# Patient Record
Sex: Male | Born: 1960 | Race: Black or African American | Hispanic: No | Marital: Married | Smoking: Current every day smoker
Health system: Southern US, Community
[De-identification: ages and names within clinical notes are randomized; demographics above are authoritative.]

## PROBLEM LIST (undated history)

## (undated) DIAGNOSIS — R7303 Prediabetes: Secondary | ICD-10-CM

## (undated) DIAGNOSIS — E669 Obesity, unspecified: Secondary | ICD-10-CM

## (undated) DIAGNOSIS — G459 Transient cerebral ischemic attack, unspecified: Secondary | ICD-10-CM

---

## 2011-08-06 ENCOUNTER — Emergency Department: Payer: Self-pay | Admitting: Emergency Medicine

## 2011-08-09 ENCOUNTER — Emergency Department: Payer: Self-pay | Admitting: Emergency Medicine

## 2021-07-04 ENCOUNTER — Observation Stay (HOSPITAL_COMMUNITY)
Admission: EM | Admit: 2021-07-04 | Discharge: 2021-07-06 | Disposition: A | Discharge status: Home / Self Care | Payer: 59 | Attending: Internal Medicine | Admitting: Internal Medicine

## 2021-07-04 ENCOUNTER — Emergency Department (HOSPITAL_COMMUNITY): Payer: 59

## 2021-07-04 DIAGNOSIS — R531 Weakness: Secondary | ICD-10-CM | POA: Diagnosis present

## 2021-07-04 DIAGNOSIS — R030 Elevated blood-pressure reading, without diagnosis of hypertension: Secondary | ICD-10-CM | POA: Diagnosis not present

## 2021-07-04 DIAGNOSIS — I639 Cerebral infarction, unspecified: Secondary | ICD-10-CM | POA: Diagnosis not present

## 2021-07-04 DIAGNOSIS — Z20822 Contact with and (suspected) exposure to covid-19: Secondary | ICD-10-CM | POA: Insufficient documentation

## 2021-07-04 DIAGNOSIS — F17210 Nicotine dependence, cigarettes, uncomplicated: Secondary | ICD-10-CM | POA: Diagnosis not present

## 2021-07-04 DIAGNOSIS — R7303 Prediabetes: Secondary | ICD-10-CM | POA: Diagnosis present

## 2021-07-04 DIAGNOSIS — Z23 Encounter for immunization: Secondary | ICD-10-CM | POA: Insufficient documentation

## 2021-07-04 DIAGNOSIS — E876 Hypokalemia: Secondary | ICD-10-CM | POA: Insufficient documentation

## 2021-07-04 DIAGNOSIS — G459 Transient cerebral ischemic attack, unspecified: Secondary | ICD-10-CM

## 2021-07-04 HISTORY — DX: Prediabetes: R73.03

## 2021-07-04 HISTORY — DX: Obesity, unspecified: E66.9

## 2021-07-04 LAB — CBG MONITORING, ED: Glucose-Capillary: 169 mg/dL — ABNORMAL HIGH (ref 70–99)

## 2021-07-04 MED ORDER — SODIUM CHLORIDE 0.9% FLUSH: 3.0000 mL | Freq: Once | INTRAVENOUS | Status: DC

## 2021-07-05 ENCOUNTER — Observation Stay (HOSPITAL_BASED_OUTPATIENT_CLINIC_OR_DEPARTMENT_OTHER): Payer: 59

## 2021-07-05 ENCOUNTER — Encounter (HOSPITAL_COMMUNITY): Payer: Self-pay | Admitting: Internal Medicine

## 2021-07-05 ENCOUNTER — Observation Stay (HOSPITAL_COMMUNITY): Payer: 59

## 2021-07-05 DIAGNOSIS — F17210 Nicotine dependence, cigarettes, uncomplicated: Secondary | ICD-10-CM | POA: Diagnosis not present

## 2021-07-05 DIAGNOSIS — E1169 Type 2 diabetes mellitus with other specified complication: Secondary | ICD-10-CM

## 2021-07-05 DIAGNOSIS — G459 Transient cerebral ischemic attack, unspecified: Secondary | ICD-10-CM | POA: Diagnosis not present

## 2021-07-05 DIAGNOSIS — R030 Elevated blood-pressure reading, without diagnosis of hypertension: Secondary | ICD-10-CM | POA: Diagnosis not present

## 2021-07-05 DIAGNOSIS — R7303 Prediabetes: Secondary | ICD-10-CM | POA: Diagnosis present

## 2021-07-05 DIAGNOSIS — I10 Essential (primary) hypertension: Secondary | ICD-10-CM | POA: Diagnosis not present

## 2021-07-05 DIAGNOSIS — R200 Anesthesia of skin: Secondary | ICD-10-CM

## 2021-07-05 DIAGNOSIS — R29701 NIHSS score 1: Secondary | ICD-10-CM

## 2021-07-05 DIAGNOSIS — R531 Weakness: Secondary | ICD-10-CM

## 2021-07-05 DIAGNOSIS — R2990 Unspecified symptoms and signs involving the nervous system: Secondary | ICD-10-CM

## 2021-07-05 LAB — I-STAT CHEM 8, ED
BUN: 9 mg/dL (ref 8–23)
Calcium, Ion: 1.16 mmol/L (ref 1.15–1.40)
Chloride: 102 mmol/L (ref 98–111)
Creatinine, Ser: 0.9 mg/dL (ref 0.61–1.24)
Glucose, Bld: 161 mg/dL — ABNORMAL HIGH (ref 70–99)
HCT: 44 % (ref 39.0–52.0)
Hemoglobin: 15 g/dL (ref 13.0–17.0)
Potassium: 3.4 mmol/L — ABNORMAL LOW (ref 3.5–5.1)
Sodium: 141 mmol/L (ref 135–145)
TCO2: 26 mmol/L (ref 22–32)

## 2021-07-05 LAB — COMPREHENSIVE METABOLIC PANEL
ALT: 27 U/L (ref 0–44)
ALT: 28 U/L (ref 0–44)
AST: 27 U/L (ref 15–41)
AST: 30 U/L (ref 15–41)
Albumin: 3.7 g/dL (ref 3.5–5.0)
Albumin: 3.9 g/dL (ref 3.5–5.0)
Alkaline Phosphatase: 77 U/L (ref 38–126)
Alkaline Phosphatase: 80 U/L (ref 38–126)
Anion gap: 10 (ref 5–15)
Anion gap: 9 (ref 5–15)
BUN: 8 mg/dL (ref 8–23)
BUN: 9 mg/dL (ref 8–23)
CO2: 24 mmol/L (ref 22–32)
CO2: 26 mmol/L (ref 22–32)
Calcium: 9.5 mg/dL (ref 8.9–10.3)
Calcium: 9.6 mg/dL (ref 8.9–10.3)
Chloride: 103 mmol/L (ref 98–111)
Chloride: 103 mmol/L (ref 98–111)
Creatinine, Ser: 0.93 mg/dL (ref 0.61–1.24)
Creatinine, Ser: 0.93 mg/dL (ref 0.61–1.24)
GFR, Estimated: >60 mL/min (ref >60)
GFR, Estimated: >60 mL/min (ref >60)
Glucose, Bld: 128 mg/dL — ABNORMAL HIGH (ref 70–99)
Glucose, Bld: 160 mg/dL — ABNORMAL HIGH (ref 70–99)
Potassium: 3.4 mmol/L — ABNORMAL LOW (ref 3.5–5.1)
Potassium: 3.8 mmol/L (ref 3.5–5.1)
Sodium: 137 mmol/L (ref 135–145)
Sodium: 138 mmol/L (ref 135–145)
Total Bilirubin: 0.8 mg/dL (ref 0.3–1.2)
Total Bilirubin: 0.8 mg/dL (ref 0.3–1.2)
Total Protein: 7.7 g/dL (ref 6.5–8.1)
Total Protein: 8 g/dL (ref 6.5–8.1)

## 2021-07-05 LAB — CBC WITH DIFFERENTIAL/PLATELET
Abs Immature Granulocytes: 0.02 10*3/uL (ref 0.00–0.07)
Basophils Absolute: 0 10*3/uL (ref 0.0–0.1)
Basophils Relative: 0 %
Eosinophils Absolute: 0.1 10*3/uL (ref 0.0–0.5)
Eosinophils Relative: 2 %
HCT: 40.8 % (ref 39.0–52.0)
Hemoglobin: 13.8 g/dL (ref 13.0–17.0)
Immature Granulocytes: 0 %
Lymphocytes Relative: 30 %
Lymphs Abs: 2.5 10*3/uL (ref 0.7–4.0)
MCH: 28.6 pg (ref 26.0–34.0)
MCHC: 33.8 g/dL (ref 30.0–36.0)
MCV: 84.5 fL (ref 80.0–100.0)
Monocytes Absolute: 0.7 10*3/uL (ref 0.1–1.0)
Monocytes Relative: 8 %
Neutro Abs: 5 10*3/uL (ref 1.7–7.7)
Neutrophils Relative %: 60 %
Platelets: 241 10*3/uL (ref 150–400)
RBC: 4.83 MIL/uL (ref 4.22–5.81)
RDW: 14.5 % (ref 11.5–15.5)
WBC: 8.5 10*3/uL (ref 4.0–10.5)
nRBC: 0 % (ref 0.0–0.2)

## 2021-07-05 LAB — CBC
HCT: 41.7 % (ref 39.0–52.0)
Hemoglobin: 13.8 g/dL (ref 13.0–17.0)
MCH: 28.1 pg (ref 26.0–34.0)
MCHC: 33.1 g/dL (ref 30.0–36.0)
MCV: 84.9 fL (ref 80.0–100.0)
Platelets: 242 10*3/uL (ref 150–400)
RBC: 4.91 MIL/uL (ref 4.22–5.81)
RDW: 14.3 % (ref 11.5–15.5)
WBC: 8.8 10*3/uL (ref 4.0–10.5)
nRBC: 0 % (ref 0.0–0.2)

## 2021-07-05 LAB — DIFFERENTIAL
Abs Immature Granulocytes: 0.02 10*3/uL (ref 0.00–0.07)
Basophils Absolute: 0 10*3/uL (ref 0.0–0.1)
Basophils Relative: 0 %
Eosinophils Absolute: 0.3 10*3/uL (ref 0.0–0.5)
Eosinophils Relative: 3 %
Immature Granulocytes: 0 %
Lymphocytes Relative: 45 %
Lymphs Abs: 4 10*3/uL (ref 0.7–4.0)
Monocytes Absolute: 0.6 10*3/uL (ref 0.1–1.0)
Monocytes Relative: 7 %
Neutro Abs: 3.9 10*3/uL (ref 1.7–7.7)
Neutrophils Relative %: 45 %

## 2021-07-05 LAB — ECHOCARDIOGRAM COMPLETE BUBBLE STUDY
AR max vel: 2.02 cm2
AV Area VTI: 2.08 cm2
AV Area mean vel: 1.78 cm2
AV Mean grad: 5.5 mmHg
AV Peak grad: 9.6 mmHg
Ao pk vel: 1.55 m/s
Area-P 1/2: 2.36 cm2
MV M vel: 1.18 m/s
MV Peak grad: 5.6 mmHg
S' Lateral: 3.45 cm

## 2021-07-05 LAB — RESP PANEL BY RT-PCR (FLU A&B, COVID) ARPGX2
Influenza A by PCR: NEGATIVE
Influenza B by PCR: NEGATIVE
SARS Coronavirus 2 by RT PCR: NEGATIVE

## 2021-07-05 LAB — HEMOGLOBIN A1C
Hgb A1c MFr Bld: 6.2 % — ABNORMAL HIGH (ref 4.8–5.6)
Mean Plasma Glucose: 131.24 mg/dL

## 2021-07-05 LAB — GLUCOSE, CAPILLARY: Glucose-Capillary: 126 mg/dL — ABNORMAL HIGH (ref 70–99)

## 2021-07-05 LAB — CBG MONITORING, ED
Glucose-Capillary: 120 mg/dL — ABNORMAL HIGH (ref 70–99)
Glucose-Capillary: 129 mg/dL — ABNORMAL HIGH (ref 70–99)
Glucose-Capillary: 124 mg/dL — ABNORMAL HIGH (ref 70–99)

## 2021-07-05 LAB — LIPID PANEL
Cholesterol: 237 mg/dL — ABNORMAL HIGH (ref 0–200)
HDL: 40 mg/dL — ABNORMAL LOW (ref >40)
LDL Cholesterol: 168 mg/dL — ABNORMAL HIGH (ref 0–99)
Total CHOL/HDL Ratio: 5.9 RATIO
Triglycerides: 145 mg/dL (ref <150)
VLDL: 29 mg/dL (ref 0–40)

## 2021-07-05 LAB — PROTIME-INR
INR: 1 (ref 0.8–1.2)
Prothrombin Time: 13.7 seconds (ref 11.4–15.2)

## 2021-07-05 LAB — APTT: aPTT: 30 seconds (ref 24–36)

## 2021-07-05 LAB — HIV ANTIBODY (ROUTINE TESTING W REFLEX): HIV Screen 4th Generation wRfx: NONREACTIVE

## 2021-07-05 LAB — TSH: TSH: 2.324 u[IU]/mL (ref 0.350–4.500)

## 2021-07-05 LAB — MAGNESIUM: Magnesium: 1.8 mg/dL (ref 1.7–2.4)

## 2021-07-05 MED ORDER — ONDANSETRON HCL 4 MG/2ML IJ SOLN: 4.0000 mg | Freq: Four times a day (QID) | INTRAMUSCULAR | Status: DC | PRN

## 2021-07-05 MED ORDER — INSULIN ASPART 100 UNIT/ML IJ SOLN
0.0000 [IU] | Freq: Three times a day (TID) | INTRAMUSCULAR | Status: DC
  Administered 2021-07-05 – 2021-07-06 (×4): 2 [IU] via SUBCUTANEOUS

## 2021-07-05 MED ORDER — ENOXAPARIN SODIUM 40 MG/0.4ML IJ SOSY
40.0000 mg | PREFILLED_SYRINGE | INTRAMUSCULAR | Status: DC
  Administered 2021-07-05 – 2021-07-06 (×2): 40 mg via SUBCUTANEOUS
  Filled 2021-07-05 (×2): qty 0.4

## 2021-07-05 MED ORDER — ONDANSETRON HCL 4 MG PO TABS: 4.0000 mg | ORAL_TABLET | Freq: Four times a day (QID) | ORAL | Status: DC | PRN

## 2021-07-05 MED ORDER — STROKE: EARLY STAGES OF RECOVERY BOOK
Freq: Once | Status: AC
  Administered 2021-07-05: 1
  Filled 2021-07-05: qty 1

## 2021-07-05 MED ORDER — POLYETHYLENE GLYCOL 3350 17 G PO PACK: 17.0000 g | PACK | Freq: Every day | ORAL | Status: DC | PRN

## 2021-07-05 MED ORDER — CLOPIDOGREL BISULFATE 75 MG PO TABS
75.0000 mg | ORAL_TABLET | Freq: Every day | ORAL | Status: DC
  Administered 2021-07-05 – 2021-07-06 (×2): 75 mg via ORAL
  Filled 2021-07-05 (×2): qty 1

## 2021-07-05 MED ORDER — IOHEXOL 350 MG/ML SOLN
100.0000 mL | Freq: Once | INTRAVENOUS | Status: AC | PRN
  Administered 2021-07-05: 100 mL via INTRAVENOUS

## 2021-07-05 MED ORDER — ASPIRIN EC 81 MG PO TBEC
81.0000 mg | DELAYED_RELEASE_TABLET | Freq: Every day | ORAL | Status: DC
  Administered 2021-07-06: 81 mg via ORAL
  Filled 2021-07-05: qty 1

## 2021-07-05 MED ORDER — ACETAMINOPHEN 650 MG RE SUPP: 650.0000 mg | Freq: Four times a day (QID) | RECTAL | Status: DC | PRN

## 2021-07-05 MED ORDER — ACETAMINOPHEN 325 MG PO TABS: 650.0000 mg | ORAL_TABLET | Freq: Four times a day (QID) | ORAL | Status: DC | PRN

## 2021-07-05 MED ORDER — ATORVASTATIN CALCIUM 80 MG PO TABS
80.0000 mg | ORAL_TABLET | Freq: Every day | ORAL | Status: DC
  Administered 2021-07-05 – 2021-07-06 (×2): 80 mg via ORAL
  Filled 2021-07-05: qty 2
  Filled 2021-07-05: qty 1

## 2021-07-05 MED ORDER — ASPIRIN 325 MG PO TABS
325.0000 mg | ORAL_TABLET | Freq: Once | ORAL | Status: AC
  Administered 2021-07-05: 325 mg via ORAL
  Filled 2021-07-05: qty 1

## 2021-07-05 NOTE — ED Notes (Signed)
PT reports a functional LLE weakness (dragging when walking) despite NIH 0/ and neuro assessment WDL.

## 2021-07-05 NOTE — Progress Notes (Signed)
2D echocardiogram with bubbles completed.  07/05/2021 9:35 AM Eula Fried., MHA, RVT, RDCS, RDMS

## 2021-07-05 NOTE — Evaluation (Signed)
Physical Therapy Evaluation Patient Details Name: Anthony Joseph MRN: [id number] DOB: May 04, 1961 Today's Date: 07/05/2021  History of Present Illness  Pt is a 60 y/o male admitted 10/23 secondary to LLE weakness. CT negative. Further workup pending. PMH includes DM and HTN.  Clinical Impression  Pt admitted secondary to problem above with deficits below. Pt requiring min A secondary to unsteadiness and LLE weakness during mobility. Pt tended to drag LLE and presented with poor coordination. Pt very adamant about returning home at d/c. Feel he would benefit from neuro outpatient follow up at d/c. Will continue to follow acutely.        Recommendations for follow up therapy are one component of a multi-disciplinary discharge planning process, led by the attending physician.  Recommendations may be updated based on patient status, additional functional criteria and insurance authorization.  Follow Up Recommendations Outpatient PT;Supervision for mobility/OOB (pending progression)    Equipment Recommendations  Rolling walker with 5" wheels    Recommendations for Other Services       Precautions / Restrictions Precautions Precautions: Fall Restrictions Weight Bearing Restrictions: No      Mobility  Bed Mobility Overal bed mobility: Needs Assistance Bed Mobility: Supine to Sit;Sit to Supine     Supine to sit: Min assist Sit to supine: Min assist   General bed mobility comments: Required assist for LLE management throughout bed mobility. Pt with increased difficulty sliding LLE off the bed to come to sitting.    Transfers Overall transfer level: Needs assistance Equipment used: None Transfers: Sit to/from Stand Sit to Stand: Min assist         General transfer comment: Min A for steadying assist to stand  Ambulation/Gait Ambulation/Gait assistance: Min assist Gait Distance (Feet): 10 Feet Assistive device: None Gait Pattern/deviations: Step-to pattern;Decreased  weight shift to left Gait velocity: Decreased   General Gait Details: Very unsteady gait without AD. Pt dragging LLE throughout and noted buckling of L knee. Min A for steadying assist.  Stairs            Wheelchair Mobility    Modified Rankin (Stroke Patients Only) Modified Rankin (Stroke Patients Only) Pre-Morbid Rankin Score: No symptoms Modified Rankin: Moderately severe disability     Balance Overall balance assessment: Needs assistance Sitting-balance support: No upper extremity supported;Feet supported Sitting balance-Leahy Scale: Fair     Standing balance support: No upper extremity supported;During functional activity Standing balance-Leahy Scale: Poor Standing balance comment: Reliant on external support                             Pertinent Vitals/Pain Pain Assessment: No/denies pain    Home Living Family/patient expects to be discharged to:: Private residence Living Arrangements: Spouse/significant other;Children Available Help at Discharge: Family Type of Home: House Home Access: Stairs to enter Entrance Stairs-Rails: None Secretary/administrator of Steps: 2 Home Layout: One level Home Equipment: Cane - single point      Prior Function Level of Independence: Independent               Hand Dominance        Extremity/Trunk Assessment   Upper Extremity Assessment Upper Extremity Assessment: Defer to OT evaluation;LUE deficits/detail LUE Deficits / Details: Decreased coordination noted when performing finger to nose test. Also noted functional weakness.    Lower Extremity Assessment Lower Extremity Assessment: LLE deficits/detail LLE Deficits / Details: Functional weakness noted, with decreased coordination. tended to "drag" LLE during mobility  tasks. Pt reports "it feels like the circulation is not in it"    Cervical / Trunk Assessment Cervical / Trunk Assessment: Normal  Communication   Communication: No difficulties   Cognition Arousal/Alertness: Awake/alert Behavior During Therapy: WFL for tasks assessed/performed Overall Cognitive Status: Within Functional Limits for tasks assessed                                        General Comments General comments (skin integrity, edema, etc.): No family present    Exercises     Assessment/Plan    PT Assessment Patient needs continued PT services  PT Problem List Decreased strength;Decreased activity tolerance;Decreased balance;Decreased mobility;Decreased knowledge of use of DME;Decreased knowledge of precautions;Decreased coordination;Decreased safety awareness       PT Treatment Interventions DME instruction;Gait training;Functional mobility training;Therapeutic activities;Therapeutic exercise;Balance training;Patient/family education;Stair training;Neuromuscular re-education    PT Goals (Current goals can be found in the Care Plan section)  Acute Rehab PT Goals Patient Stated Goal: to go home PT Goal Formulation: With patient Time For Goal Achievement: 07/19/21 Potential to Achieve Goals: Good    Frequency Min 4X/week   Barriers to discharge        Co-evaluation               AM-PAC PT "6 Clicks" Mobility  Outcome Measure Help needed turning from your back to your side while in a flat bed without using bedrails?: None Help needed moving from lying on your back to sitting on the side of a flat bed without using bedrails?: A Little Help needed moving to and from a bed to a chair (including a wheelchair)?: A Little Help needed standing up from a chair using your arms (e.g., wheelchair or bedside chair)?: A Little Help needed to walk in hospital room?: A Lot Help needed climbing 3-5 steps with a railing? : Total 6 Click Score: 16    End of Session Equipment Utilized During Treatment: Gait belt Activity Tolerance: Patient tolerated treatment well Patient left: in bed;with call bell/phone within reach (on stretcher in  ED) Nurse Communication: Mobility status PT Visit Diagnosis: Unsteadiness on feet (R26.81);Muscle weakness (generalized) (M62.81)    Time: 4627-0350 PT Time Calculation (min) (ACUTE ONLY): 18 min   Charges:   PT Evaluation $PT Eval Moderate Complexity: 1 Mod          Farley Ly, PT, DPT  Acute Rehabilitation Services  Pager: [phone] Office: [phone]   Lehman Prom 07/05/2021, 1:48 PM

## 2021-07-05 NOTE — Assessment & Plan Note (Signed)
   Counseling on cessation  Patient declines nicotine replacement therapy

## 2021-07-05 NOTE — Progress Notes (Signed)
Care started prior to midnight in the emergency room and the patient was admitted early this morning after midnight by Dr. Brynda Peon and I am in current agreement with his assessment and plan.  Additional changes to the plan of care been made accordingly.  The patient is a 60 year old obese male with a past medical history significant for but not limited to prediabetes as well as other comorbidities who presented with left-sided numbness and weakness.  Approximately around 8 PM after cooking steaks for himself and his daughter he was sitting down to eat recently noticed that his left hand has sensation of intense numbness.  Within several minutes the sensation was followed by almost complete paralysis of the left arm in addition to the left leg.  Patient also reported a left facial droop could not any headaches, changes in vision or difficulty with speech.  EMS was contacted who brought the patient to the ED for code stroke.  Upon arrival to the ED a stat CTA head was done without contrast and revealed no evidence of acute CVA.  Neurology evaluated the patient and noted the patient's neurological deficits were rapidly improving so tPA was not administered.  Patient was admitted to the hospital for his transient left-sided deficits and was admitted for TIA work-up.  Currently he is being admitted and treated for the following but not limited to:  Acute left-sided weakness with transient left-sided deficits and decreased sensation to the upper left extremity -Noncontrast CT imaging of the head in the ED reveals no evidence of acute stroke. -On exam most of patient's left-sided weakness has resolved however patient still exhibits a very slight left facial droop -Performing serial neurologic checks  -Continue to Monitor the patient on telemetry -Initiating aspirin therapy with 325 mg now followed by 81 mg daily.  Additionally initiating Plavix 75 mg daily for 3 weeks per Neurology Recc's. -Daily statin  therapy to be initiated if LDL greater than 70 on lipid panel -Further imaging to include: MRI of the brain without contrast as well as MRA of the head and neck and these are pending to be done -Obtaining hemoglobin A1c and lipid panel in the morning -Echocardiogram ordered and done but pending read -PT, OT, SLP evaluation -Permissive hypertension with as needed antihypertensives only to be given if blood pressure greater than 220/110 for the first 24 hours -Neurology following in consultation and appreciate Recc's     Elevated blood-pressure reading without diagnosis of hypertension -Patient exhibiting elevated blood pressures throughout the emergency department stay -Patient states that he has "borderline hypertension" and has never been on medications before -Elevated blood pressures may be secondary to an acute stroke -Will allow for permissive hypertension for now and only provide hydralazine if blood pressures are higher than 220/110 for the first 24 hours -Continue monitor blood pressures per protocol last blood pressure reading was 148/94   Prediabetes -Patient reports history of prediabetes and is currently not on therapy -Patient is notably hyperglycemic on initial chemistry -Due to hyperglycemia will initiate before every meal and nightly Accu-Cheks with sliding scale insulin -Obtaining hemoglobin A1c and this was 6.2 -Continue to monitor CBGs per protocol: Last blood sugar was 120   Nicotine dependence, cigarettes, uncomplicated -Counseling on cessation -Patient declines nicotine replacement therapy  Hypokalemia -Patient's potassium was 3.4 -Replete and improved and potassium is now 3.8  -Continue monitor and replete as necessary; mag level was 1.8 -Repeat CMP in the AM  Obesity -Complicates overall prognosis and care -There is no height  or weight on file to calculate BMI. -Weight Loss and Dietary Counseling given   We will continue to monitor the patient's clinical  response to intervention and repeat blood work and follow-up on the imaging ordered by neurology.  Will need PT OT to further evaluate and treat and await the results of all the studies ordered

## 2021-07-05 NOTE — H&P (Signed)
History and Physical    Anthony Joseph OVF:[id number] DOB: 19-Oct-1960 DOA: 07/04/2021  PCP: Pcp, No  Patient coming from: Home via EMS   Chief Complaint: Left sided weakness    HPI:    60 year old male with past medical history of prediabetes, obesity who presents to [facility] emergency department via EMS with complaints of left-sided weakness.  Patient explains that at approximately 8 PM after cooking steaks for himself and his daughter he was sitting down to eat when he suddenly noticed that his left hand had a sensation of intense numbness.  Within several minutes the sensation was followed by almost complete paralysis of the left arm in addition to the left leg.  Patient also reports left-sided facial droop.  Patient denies any headaches, changes in vision or difficulty with speech.  EMS was contacted shortly thereafter who quickly arrived and brought the patient to [facility] emergency department as a code stroke.  Upon arrival to the emergency department stat CT imaging of the head without contrast revealed no evidence of acute stroke.  Neurology promptly evaluated the patient and noted the patient's neurologic deficits were rapidly improving.  tPA was not administered.  Neurology recommended hospitalist admission for usual work-up.  The hospitalist group was then called to assess the patient for admission to the hospital.  Review of Systems:   ROS  Past Medical History:  Diagnosis Date   Obesity    Prediabetes     History reviewed. No pertinent surgical history.   reports that he has been smoking cigarettes. He has been smoking an average of .5 packs per day. He has never used smokeless tobacco. He reports current alcohol use. He reports that he does not use drugs.  Not on File  Family History  Problem Relation Age of Onset   Stroke Neg Hx    Heart disease Neg Hx      Prior to Admission medications   Not on File    Physical Exam: Vitals:    07/05/21 0030 07/05/21 0100 07/05/21 0130 07/05/21 0200  BP: (!) 165/94 (!) 184/112 (!) 163/109 (!) 167/104  Pulse: 84 95 87 88  Resp: 20 15 14    SpO2: 95% 100% 95% 96%  Weight:        Constitutional: Awake alert and oriented x3, no associated distress.   Skin: no rashes, no lesions, good skin turgor noted. Eyes: Pupils are equally reactive to light.  No evidence of scleral icterus or conjunctival pallor.  ENMT: Moist mucous membranes noted.  Posterior pharynx clear of any exudate or lesions.   Neck: normal, supple, no masses, no thyromegaly.  No evidence of jugular venous distension.   Respiratory: clear to auscultation bilaterally, no wheezing, no crackles. Normal respiratory effort. No accessory muscle use.  Cardiovascular: Regular rate and rhythm, no murmurs / rubs / gallops. No extremity edema. 2+ pedal pulses. No carotid bruits.  Chest:   Nontender without crepitus or deformity.   Back:   Nontender without crepitus or deformity. Abdomen: Abdomen is soft and nontender.  No evidence of intra-abdominal masses.  Positive bowel sounds noted in all quadrants.   Musculoskeletal: No joint deformity upper and lower extremities. Good ROM, no contractures. Normal muscle tone.  Neurologic: Very faint left facial droop noted.  Otherwise, CN 2-12 grossly intact. Sensation intact.  Patient moving all 4 extremities spontaneously.  Patient is following all commands.  Patient is responsive to verbal stimuli.   Psychiatric: Patient exhibits normal mood with appropriate affect.  Patient seems to possess insight as to their current situation.     Labs on Admission: I have personally reviewed following labs and imaging studies -   CBC: Recent Labs  Lab 07/04/21 2346  WBC 8.8  NEUTROABS 3.9  HGB 13.8  HCT 41.7  MCV 84.9  PLT 242   Basic Metabolic Panel: Recent Labs  Lab 07/04/21 2346  NA 137  K 3.4*  CL 103  CO2 24  GLUCOSE 160*  BUN 9  CREATININE 0.93  CALCIUM 9.6   GFR: CrCl  cannot be calculated (Unknown ideal weight.). Liver Function Tests: Recent Labs  Lab 07/04/21 2346  AST 30  ALT 27  ALKPHOS 77  BILITOT 0.8  PROT 8.0  ALBUMIN 3.9   No results for input(s): LIPASE, AMYLASE in the last 168 hours. No results for input(s): AMMONIA in the last 168 hours. Coagulation Profile: Recent Labs  Lab 07/04/21 2346  INR 1.0   Cardiac Enzymes: No results for input(s): CKTOTAL, CKMB, CKMBINDEX, TROPONINI in the last 168 hours. BNP (last 3 results) No results for input(s): PROBNP in the last 8760 hours. HbA1C: No results for input(s): HGBA1C in the last 72 hours. CBG: Recent Labs  Lab 07/04/21 2349  GLUCAP 169*   Lipid Profile: No results for input(s): CHOL, HDL, LDLCALC, TRIG, CHOLHDL, LDLDIRECT in the last 72 hours. Thyroid Function Tests: No results for input(s): TSH, T4TOTAL, FREET4, T3FREE, THYROIDAB in the last 72 hours. Anemia Panel: No results for input(s): VITAMINB12, FOLATE, FERRITIN, TIBC, IRON, RETICCTPCT in the last 72 hours. Urine analysis: No results found for: COLORURINE, APPEARANCEUR, LABSPEC, PHURINE, GLUCOSEU, HGBUR, BILIRUBINUR, KETONESUR, PROTEINUR, UROBILINOGEN, NITRITE, LEUKOCYTESUR  Radiological Exams on Admission - Personally Reviewed: CT HEAD CODE STROKE WO CONTRAST  Result Date: 07/05/2021 CLINICAL DATA:  Code stroke.  Neuro deficit, stroke suspected EXAM: CT HEAD WITHOUT CONTRAST TECHNIQUE: Contiguous axial images were obtained from the base of the skull through the vertex without intravenous contrast. COMPARISON:  None. FINDINGS: Brain: No evidence of acute infarction, hemorrhage, cerebral edema, mass, mass effect, or midline shift. No extra-axial fluid collection. Vascular: No hyperdense vessel or unexpected calcification. Skull: Normal. Negative for fracture or focal lesion. Sinuses/Orbits: No acute finding. Other: The mastoid air cells are well aerated. ASPECTS [facility] Stroke Program Early CT Score) - Ganglionic level  infarction (caudate, lentiform nuclei, internal capsule, insula, M1-M3 cortex): 7 - Supraganglionic infarction (M4-M6 cortex): 3 Total score (0-10 with 10 being normal): 10 IMPRESSION: 1. No acute intracranial process. 2. ASPECTS is 10 Code stroke imaging results were communicated on 07/04/2021 at 11:58 pm to provider [facility] via secure text paging. Electronically Signed   By: Wiliam Ke M.D.   On: 07/05/2021 00:00    EKG: Personally reviewed.  Rhythm is normal sinus rhythm with heart rate of 82 bpm.  No dynamic ST segment changes appreciated.  Assessment/Plan  * Acute left-sided weakness Noncontrast CT imaging of the head in the ED reveals no evidence of acute stroke. On exam most of patient's left-sided weakness has resolved however patient still exhibits a very slight left facial droop Performing serial neurologic checks Monitoring patient on telemetry Initiating aspirin therapy with 325 mg now followed by 81 mg daily.  Additionally initiating Plavix 75 mg daily. Daily statin therapy to be initiated if LDL greater than 70 on lipid panel Further imaging to include: MRI of the brain without contrast as well as MRA of the head and neck. Obtaining hemoglobin A1c and lipid panel in the morning Echocardiogram in the  morning PT, OT, SLP evaluation Permissive hypertension with as needed antihypertensives only to be given if blood pressure greater than 220/115 Neurology following in consultation.   Elevated blood-pressure reading without diagnosis of hypertension Patient exhibiting elevated blood pressures throughout the emergency department stay Patient states that he has "borderline hypertension" and has never been on medications before Elevated blood pressures may be secondary to an acute stroke Will allow for permissive hypertension for now and only provide hydralazine if blood pressures are higher than 220/115  Prediabetes Patient reports history of prediabetes and is currently not on  therapy Patient is notably hyperglycemic on initial chemistry Due to hyperglycemia will initiate before every meal and nightly Accu-Cheks with sliding scale insulin Obtaining hemoglobin A1c  Nicotine dependence, cigarettes, uncomplicated Counseling on cessation Patient declines nicotine replacement therapy       Code Status:  Full code  code status decision has been confirmed with: Patient Family Communication: deferred   Status is: Observation  The patient remains OBS appropriate and will d/c before 2 midnights.       Marinda Elk MD [facility] Pager [phone]  If 7PM-7AM, please contact night-coverage [email or web address] Use universal [place] password for that web site. If you do not have the password, please call the hospital operator.  07/05/2021, 3:08 AM

## 2021-07-05 NOTE — Assessment & Plan Note (Signed)
   Noncontrast CT imaging of the head in the ED reveals no evidence of acute stroke.  On exam most of patient's left-sided weakness has resolved however patient still exhibits a very slight left facial droop  Performing serial neurologic checks  Monitoring patient on telemetry  Initiating aspirin therapy with 325 mg now followed by 81 mg daily.  Additionally initiating Plavix 75 mg daily.  Daily statin therapy to be initiated if LDL greater than 70 on lipid panel  Further imaging to include: MRI of the brain without contrast as well as MRA of the head and neck. Obtaining hemoglobin A1c and lipid panel in the morning  Echocardiogram in the morning  PT, OT, SLP evaluation  Permissive hypertension with as needed antihypertensives only to be given if blood pressure greater than 220/115  Neurology following in consultation.

## 2021-07-05 NOTE — Progress Notes (Addendum)
STROKE TEAM PROGRESS NOTE   INTERVAL HISTORY He is lying on the stretcher in NAD. He states his left side numbness and weakness is almost back to normal, just has a little numbness in left hand.Patient endorses that he will not be able to tolerate MRI so will d/c and order CTA head and neck. No new neurological events overnight   Vitals:   07/05/21 1215 07/05/21 1230 07/05/21 1245 07/05/21 1300  BP: (!) 152/139 (!) 147/105 (!) 153/106 (!) 147/90  Pulse: 83 77 78 71  Resp:      Temp:      TempSrc:      SpO2: 96% 94% 98% 93%  Weight:       CBC:  Recent Labs  Lab 07/04/21 2346 07/05/21 0358  WBC 8.8 8.5  NEUTROABS 3.9 5.0  HGB 13.8 13.8  HCT 41.7 40.8  MCV 84.9 84.5  PLT 242 241   Basic Metabolic Panel:  Recent Labs  Lab 07/04/21 2346 07/05/21 0358  NA 137 138  K 3.4* 3.8  CL 103 103  CO2 24 26  GLUCOSE 160* 128*  BUN 9 8  CREATININE 0.93 0.93  CALCIUM 9.6 9.5  MG  --  1.8   Lipid Panel:  Recent Labs  Lab 07/05/21 1003  CHOL 237*  TRIG 145  HDL 40*  CHOLHDL 5.9  VLDL 29  LDLCALC 585*   HgbA1c:  Recent Labs  Lab 07/05/21 0359  HGBA1C 6.2*   Urine Drug Screen: No results for input(s): LABOPIA, COCAINSCRNUR, LABBENZ, AMPHETMU, THCU, LABBARB in the last 168 hours.  Alcohol Level No results for input(s): ETH in the last 168 hours.  IMAGING past 24 hours ECHOCARDIOGRAM COMPLETE BUBBLE STUDY  Result Date: 07/05/2021    ECHOCARDIOGRAM REPORT   Patient Name:   ASENCION LOVEDAY Date of Exam: 07/05/2021 Medical Rec #:  [id number]        Height: Accession #:    [id number]       Weight:       260.4 lb Date of Birth:  10/21/1960        BSA:          2.408 m Patient Age:    60 years         BP:           148/94 mmHg Patient Gender: M                HR:           73 bpm. Exam Location:  Inpatient Procedure: 2D Echo, Cardiac Doppler, Color Doppler and Saline Contrast Bubble            Study Indications:    Stroke  History:        Patient has no prior history of  Echocardiogram examinations.                 Risk Factors:pre-diabetes.  Sonographer:    Gertie Fey MHA, RDMS, RVT, RDCS Referring Phys: [id number] Deno Lunger [facility]  Sonographer Comments: Image acquisition challenging due to patient body habitus and hypersomnolence, restricted mobility. IMPRESSIONS  1. Left ventricular ejection fraction, by estimation, is 55 to 60%. The left ventricle has normal function. The left ventricle has no regional wall motion abnormalities. Left ventricular diastolic parameters are indeterminate.  2. Right ventricular systolic function is normal. The right ventricular size is normal. There is normal pulmonary artery systolic pressure.  3. The mitral valve is grossly normal. No evidence of mitral valve regurgitation.  No evidence of mitral stenosis.  4. The aortic valve is grossly normal. There is mild calcification of the aortic valve. Aortic valve regurgitation is trivial. Mild aortic valve sclerosis is present, with no evidence of aortic valve stenosis.  5. The inferior vena cava is normal in size with greater than 50% respiratory variability, suggesting right atrial pressure of 3 mmHg.  6. Agitated saline contrast bubble study was negative, with no evidence of any interatrial shunt. Comparison(s): No prior Echocardiogram. Conclusion(s)/Recommendation(s): Normal biventricular function without evidence of hemodynamically significant valvular heart disease. No intracardiac source of embolism detected on this transthoracic study. A transesophageal echocardiogram is recommended to exclude cardiac source of embolism if clinically indicated. FINDINGS  Left Ventricle: Left ventricular ejection fraction, by estimation, is 55 to 60%. The left ventricle has normal function. The left ventricle has no regional wall motion abnormalities. The left ventricular internal cavity size was normal in size. There is  no left ventricular hypertrophy. Left ventricular diastolic parameters are indeterminate.  Right Ventricle: The right ventricular size is normal. Right vetricular wall thickness was not well visualized. Right ventricular systolic function is normal. There is normal pulmonary artery systolic pressure. The tricuspid regurgitant velocity is 1.83 m/s, and with an assumed right atrial pressure of 3 mmHg, the estimated right ventricular systolic pressure is 16.4 mmHg. Left Atrium: Left atrial size was normal in size. Right Atrium: Right atrial size was normal in size. Pericardium: There is no evidence of pericardial effusion. Presence of pericardial fat pad. Mitral Valve: The mitral valve is grossly normal. No evidence of mitral valve regurgitation. No evidence of mitral valve stenosis. Tricuspid Valve: The tricuspid valve is normal in structure. Tricuspid valve regurgitation is trivial. No evidence of tricuspid stenosis. Aortic Valve: The aortic valve is grossly normal. There is mild calcification of the aortic valve. Aortic valve regurgitation is trivial. Mild aortic valve sclerosis is present, with no evidence of aortic valve stenosis. Aortic valve mean gradient measures 5.5 mmHg. Aortic valve peak gradient measures 9.6 mmHg. Aortic valve area, by VTI measures 2.08 cm. Pulmonic Valve: The pulmonic valve was not well visualized. Pulmonic valve regurgitation is not visualized. No evidence of pulmonic stenosis. Aorta: The aortic root, ascending aorta and aortic arch are all structurally normal, with no evidence of dilitation or obstruction. Venous: The inferior vena cava is normal in size with greater than 50% respiratory variability, suggesting right atrial pressure of 3 mmHg. IAS/Shunts: No atrial level shunt detected by color flow Doppler. Agitated saline contrast was given intravenously to evaluate for intracardiac shunting. Agitated saline contrast bubble study was negative, with no evidence of any interatrial shunt.  LEFT VENTRICLE PLAX 2D LVIDd:         4.90 cm   Diastology LVIDs:         3.45 cm   LV  e' medial:    6.42 cm/s LV PW:         1.00 cm   LV E/e' medial:  15.8 LV IVS:        0.95 cm   LV e' lateral:   11.70 cm/s LVOT diam:     1.75 cm   LV E/e' lateral: 8.7 LV SV:         59 LV SV Index:   24 LVOT Area:     2.41 cm  RIGHT VENTRICLE RV S prime:     15.90 cm/s TAPSE (M-mode): 2.1 cm LEFT ATRIUM             Index  RIGHT ATRIUM           Index LA diam:        3.35 cm 1.39 cm/m   RA Area:     11.30 cm LA Vol (A2C):   67.0 ml 27.83 ml/m  RA Volume:   21.00 ml  8.72 ml/m LA Vol (A4C):   57.8 ml 24.01 ml/m LA Biplane Vol: 67.8 ml 28.16 ml/m  AORTIC VALVE AV Area (Vmax):    2.02 cm AV Area (Vmean):   1.78 cm AV Area (VTI):     2.08 cm AV Vmax:           155.00 cm/s AV Vmean:          103.200 cm/s AV VTI:            0.282 m AV Peak Grad:      9.6 mmHg AV Mean Grad:      5.5 mmHg LVOT Vmax:         130.00 cm/s LVOT Vmean:        76.500 cm/s LVOT VTI:          0.244 m LVOT/AV VTI ratio: 0.87  AORTA Ao Root diam: 2.60 cm MITRAL VALVE                TRICUSPID VALVE MV Area (PHT): 2.36 cm     TR Peak grad:   13.4 mmHg MV Decel Time: 322 msec     TR Vmax:        183.00 cm/s MR Peak grad: 5.6 mmHg MR Vmax:      118.00 cm/s   SHUNTS MV E velocity: 101.67 cm/s  Systemic VTI:  0.24 m MV A velocity: 102.00 cm/s  Systemic Diam: 1.75 cm MV E/A ratio:  1.00 Jodelle Red MD Electronically signed by Jodelle Red MD Signature Date/Time: 07/05/2021/1:03:05 PM    Final    CT HEAD CODE STROKE WO CONTRAST  Result Date: 07/05/2021 CLINICAL DATA:  Code stroke.  Neuro deficit, stroke suspected EXAM: CT HEAD WITHOUT CONTRAST TECHNIQUE: Contiguous axial images were obtained from the base of the skull through the vertex without intravenous contrast. COMPARISON:  None. FINDINGS: Brain: No evidence of acute infarction, hemorrhage, cerebral edema, mass, mass effect, or midline shift. No extra-axial fluid collection. Vascular: No hyperdense vessel or unexpected calcification. Skull: Normal. Negative for  fracture or focal lesion. Sinuses/Orbits: No acute finding. Other: The mastoid air cells are well aerated. ASPECTS [facility] Stroke Program Early CT Score) - Ganglionic level infarction (caudate, lentiform nuclei, internal capsule, insula, M1-M3 cortex): 7 - Supraganglionic infarction (M4-M6 cortex): 3 Total score (0-10 with 10 being normal): 10 IMPRESSION: 1. No acute intracranial process. 2. ASPECTS is 10 Code stroke imaging results were communicated on 07/04/2021 at 11:58 pm to provider [facility] via secure text paging. Electronically Signed   By: Wiliam Ke M.D.   On: 07/05/2021 00:00    PHYSICAL EXAM  Physical Exam  Constitutional: Obese middle-aged African-American male not in distress Psych: Affect appropriate to situation Eyes: No scleral injection HENT: No OP obstrucion MSK: no joint deformities.  Cardiovascular: Normal rate and regular rhythm.  Respiratory: Effort normal, non-labored breathing GI: Soft.  No distension. There is no tenderness.  Skin: WDI  Neuro: Mental Status: Patient is awake, alert, oriented to person, place, month, year, and situation. Patient is able to give a clear and coherent history. No signs of aphasia or neglect Cranial Nerves: II: Visual Fields are full. Pupils are equal, round, and reactive to  light.   III,IV, VI: EOMI without ptosis or diploplia.  V: Facial sensation is symmetric to temperature VII: Facial movement is symmetric.  VIII: hearing is intact to voice X: Uvula elevates symmetrically XI: Shoulder shrug is symmetric. XII: tongue is midline without atrophy or fasciculations.  Motor: Tone is normal. Bulk is normal. 5/5 strength was present in all four extremities.  Sensory: Sensation is symmetric to light touch and temperature in the arms and legs. Deep Tendon Reflexes: 2+ and symmetric in the biceps and patellae.  Plantars: Toes are downgoing bilaterally.  Cerebellar: FNF and HKS are intact bilaterally  ASSESSMENT/PLAN [name] is a 60 y.o. male with history of  untreated diabetes, HTN with transient left-sided deficits now resolved except for very mild decreased sensation to left upper extremity   Transient left-sided numbness episode possibly right brain TIA versus small infarct likely from small vessel disease  code Stroke CT head No acute abnormality. ASPECTS 10.    CTA head & neck ordered MRI  unable to tolerate 2D Echo pending LDL 168 HgbA1c 6.2 VTE prophylaxis - SCD's lovenox    Diet   Diet heart healthy/carb modified Room service appropriate? Yes; Fluid consistency: Thin   No antithrombotic prior to admission, now on aspirin 81 mg daily and clopidogrel 75 mg daily.  Therapy recommendations:  pending Disposition:  probably home  Hypertension Home meds:  no meds  UnStable Permissive hypertension (OK if < 220/120) but gradually normalize in 5-7 days Long-term BP goal normotensive  Hyperlipidemia Home meds:  none,  LDL 168, goal < 70 Add atorvastatin 80mg    Continue statin at discharge   Other Stroke Risk Factors Cigarette smoker advised to stop smoking Obesity, BMI >/= 30 associated with increased stroke risk, recommend weight loss, diet and exercise as appropriate   Other Active Problems Tobacco Use -counseled on cessation, declines nicotine patch  Hospital day # 0  , NP  STROKE MD NOTE :   I have personally obtained history,examined this patient, reviewed notes, independently viewed imaging studies, participated in medical decision making and plan of care.ROS completed by me personally and pertinent positives fully documented  I have made any additions or clarifications directly to the above note. Agree with note above.  Patient presented with transient left sided numbness which appears to be recovering and almost back to baseline likely right hemispheric TIA versus small infarct not visualized on CT.  Patient is clearly states he will not be able to tolerate an MRI  even with sedation hence will cancel it.  Instead repeat CT scan with CT angiogram of brain and neck.  Continue ongoing stroke work-up.  Aggressive risk factor modification.  Recommend dual antiplatelet therapy aspirin and Plavix for 3 weeks followed by aspirin alone.  Greater than 50% time during this 35-minute visit was spent on counseling and coordination of care about his stroke and TIA work-up discussion about secondary prevention and answering questions.  Discussed with Dr. Gevena Mart.  Marland Mcalpine, MD Medical Director [facility] Pager: [phone] 07/05/2021 3:28 PM  To contact Stroke Continuity provider, please refer to 07/07/2021. After hours, contact General Neurology

## 2021-07-05 NOTE — Code Documentation (Addendum)
Responded to Code Stroke called at 2331 for L sided deficits and L facial droop, LSN-1800. Pt arrived at 2344, CBG-169, NIH-9. While in CT, pt's symptoms rapidly began to improve, NIH-5. CTH-negative for acute changes. TNK not given-improving symptoms. Plan: TIA alert and ASA.

## 2021-07-05 NOTE — ED Notes (Signed)
120 mg/dl

## 2021-07-05 NOTE — Assessment & Plan Note (Signed)
   Patient reports history of prediabetes and is currently not on therapy  Patient is notably hyperglycemic on initial chemistry  Due to hyperglycemia will initiate before every meal and nightly Accu-Cheks with sliding scale insulin  Obtaining hemoglobin A1c

## 2021-07-05 NOTE — Consult Note (Signed)
Neurology Consult H&P  Anthony Joseph MR# [id number] 07/05/2021   CC: left sided numbness and weakness  History is obtained from: patient and chart.  HPI: Anthony Joseph is a 60 y.o. male PMHx as reviewed below diabetes (untreated), hypertension was eating dinner and developed acute left-sided numbness and weakness.  He stated has left arm and leg just would not move.  EMS arrived and noted patient was able to talk however had left face droop and left arm and leg were flaccid.   On arrival his initial NIH was 9 which decreased to a 5.  At the CT scanner his deficits resolved and only felt very mild decrease sensation to light touch in the left arm.   LKW: 1800 tNK given: No NIHSS to low IR Thrombectomy No Modified Rankin Scale: 0-Completely asymptomatic and back to baseline post- stroke NIHSS: 1  ROS: A complete ROS was performed and is negative except as noted in the HPI.   No past medical history on file.   No family history on file.  Social History:  has no history on file for tobacco use, alcohol use, and drug use.   Prior to Admission medications   Not on File    Exam: Current vital signs: Wt 118.1 kg   Physical Exam  Constitutional: Appears well-developed and well-nourished.  Psych: Affect appropriate to situation Eyes: No scleral injection HENT: No OP obstruction. Head: Normocephalic.  Cardiovascular: Normal rate and regular rhythm.  Respiratory: Effort normal, symmetric excursions bilaterally, no audible wheezing. GI: Soft.  No distension. There is no tenderness.  Skin: WDI  Neuro: Mental Status: Patient is awake, alert, oriented to person, place, month, year, and situation. Patient is able to give a clear and coherent history. Speech  fluent, intact comprehension and repetition. No signs of aphasia or neglect. Visual Fields are full. Pupils are equal, round, and reactive to light. EOMI without ptosis or diploplia.  Facial sensation is symmetric  to temperature Facial movement is symmetric.  Hearing is intact to voice. Uvula midline and palate elevates symmetrically. Shoulder shrug is symmetric. Tongue is midline without atrophy or fasciculations.  Tone is normal. Bulk is normal. 5/5 strength was present in all four extremities. Sensation mildly decreased to light touch in the left upper extremity.   Deep Tendon Reflexes: 2+ and symmetric in the biceps and patellae. Toes are downgoing bilaterally. FNF and HKS are intact bilaterally. Gait - Deferred  I have reviewed labs in epic and the pertinent results are: GLU 160  I have reviewed the images obtained: NCT head showed no acute ischemic changes, hemorrhage mass lesion aspects 10 .  Assessment: Anthony Joseph is a 60 y.o. male PMHx untreated diabetes, HTN with transient left-sided deficits now resolved except for very mild decreased sensation to left upper extremity - NIHSS 1.  He has vascular risk factors which are likely not treated and will need to be admitted for further TIA work-up.  Recommended aspirin 324mg  now.  Plan: -Admit to hospitalist service. - MRI brain MRA head and neck - Ordered. - Recommend TTE. - Recommend labs: HbA1c, lipid panel, TSH. - Recommend Statin if LDL > 70 - Aspirin 81mg  daily. - Clopidogrel 75mg  daily for 3 weeks.  - Permissive hypertension first 24 h < 220/110.  - Telemetry monitoring for arrhythmia. - Recommend bedside Swallow screen. - Recommend Stroke education. - Recommend PT/OT/SLP consult.  This patient is critically ill and at significant risk of neurological worsening, death and care requires constant monitoring of  vital signs, hemodynamics,respiratory and cardiac monitoring, neurological assessment, discussion with family, other specialists and medical decision making of high complexity. I spent 73 minutes of neurocritical care time  in the care of  this patient. This was time spent independent of any time provided by nurse  practitioner or PA.  Electronically signed by:  Marisue Humble, MD Page: [phone] 07/05/2021, 12:36 AM

## 2021-07-05 NOTE — ED Triage Notes (Signed)
Pt from home via [facility] as Code Stroke. LKW 1800, pt was eating a steak & said that his L side "felt like numbing medication was wearing off." Per EMS, pt completely flaccid on their arrival, since resolved. Initial NIH in ED 9, down to a 5.   18RAC 185/113 196 CBG

## 2021-07-05 NOTE — Assessment & Plan Note (Signed)
   Patient exhibiting elevated blood pressures throughout the emergency department stay  Patient states that he has "borderline hypertension" and has never been on medications before  Elevated blood pressures may be secondary to an acute stroke  Will allow for permissive hypertension for now and only provide hydralazine if blood pressures are higher than 220/115

## 2021-07-05 NOTE — ED Provider Notes (Signed)
[facility] EMERGENCY DEPARTMENT Provider Note   CSN: [id number] Arrival date & time: 07/04/21  2344  An emergency department physician performed an initial assessment on this suspected stroke patient at 2344.  History No chief complaint on file.   Anthony Joseph is a 60 y.o. male.  The history is provided by the patient.  Weakness Severity:  Severe Onset quality:  Sudden Timing:  Constant Progression:  Resolved Chronicity:  New Relieved by:  None tried Worsened by:  Nothing Associated symptoms: headaches and sensory-motor deficit   Associated symptoms: no chest pain, no fever and no vomiting   Patient presents as a code stroke.  Last known well 1800 on October 22 Patient reports he had left-sided arm and leg weakness.  Also reported he had facial weakness.  This occurred around the time he was eating a steak.  He reports he had difficulty holding utensils.  His symptoms are now improving Patient reports that he took a "sex pill" several hours prior to this and he thinks that might be related   He does not take any daily meds     PMH-unknown Soc hx - ETOH use Home Medications Prior to Admission medications   Not on File    Allergies    Patient has no allergy information on record.  Review of Systems   Review of Systems  Constitutional:  Negative for fever.  Cardiovascular:  Negative for chest pain.  Gastrointestinal:  Negative for vomiting.  Neurological:  Positive for weakness and headaches.  All other systems reviewed and are negative.  Physical Exam Updated Vital Signs BP (!) 184/112   Pulse 95   Resp 15   Wt 118.1 kg   SpO2 100%   Physical Exam CONSTITUTIONAL: Well developed/well nourished HEAD: Normocephalic/atraumatic EYES: EOMI/PERRL ENMT: Mucous membranes moist NECK: supple no meningeal signs SPINE/BACK:entire spine nontender CV: S1/S2 noted, no murmurs/rubs/gallops noted LUNGS: Lungs are clear to auscultation  bilaterally, no apparent distress ABDOMEN: soft, nontender, no rebound or guarding, bowel sounds noted throughout abdomen GU:no cva tenderness NEURO: Pt is awake/alert/appropriate, moves all extremitiesx4.  No facial droop.  No arm or leg drift.   No obvious sensory deficit.  Speech is clear EXTREMITIES: pulses normal/equal, full ROM SKIN: warm, color normal PSYCH: no abnormalities of mood noted, alert and oriented to situation  ED Results / Procedures / Treatments   Labs (all labs ordered are listed, but only abnormal results are displayed) Labs Reviewed  COMPREHENSIVE METABOLIC PANEL - Abnormal; Notable for the following components:      Result Value   Potassium 3.4 (*)    Glucose, Bld 160 (*)    All other components within normal limits  CBG MONITORING, ED - Abnormal; Notable for the following components:   Glucose-Capillary 169 (*)    All other components within normal limits  PROTIME-INR  APTT  CBC  DIFFERENTIAL  I-STAT CHEM 8, ED    EKG EKG Interpretation  Date/Time:  Sunday July 05 2021 00:50:22 EDT Ventricular Rate:  82 PR Interval:  179 QRS Duration: 97 QT Interval:  371 QTC Calculation: 434 R Axis:   98 Text Interpretation: Sinus rhythm Probable left atrial enlargement Inferior infarct, age indeterminate No previous ECGs available Confirmed by Zadie Rhine ([id number]) on 07/05/2021 12:58:51 AM  Radiology CT HEAD CODE STROKE WO CONTRAST  Result Date: 07/05/2021 CLINICAL DATA:  Code stroke.  Neuro deficit, stroke suspected EXAM: CT HEAD WITHOUT CONTRAST TECHNIQUE: Contiguous axial images were obtained from the base of the  skull through the vertex without intravenous contrast. COMPARISON:  None. FINDINGS: Brain: No evidence of acute infarction, hemorrhage, cerebral edema, mass, mass effect, or midline shift. No extra-axial fluid collection. Vascular: No hyperdense vessel or unexpected calcification. Skull: Normal. Negative for fracture or focal lesion.  Sinuses/Orbits: No acute finding. Other: The mastoid air cells are well aerated. ASPECTS [facility] Stroke Program Early CT Score) - Ganglionic level infarction (caudate, lentiform nuclei, internal capsule, insula, M1-M3 cortex): 7 - Supraganglionic infarction (M4-M6 cortex): 3 Total score (0-10 with 10 being normal): 10 IMPRESSION: 1. No acute intracranial process. 2. ASPECTS is 10 Code stroke imaging results were communicated on 07/04/2021 at 11:58 pm to provider [facility] via secure text paging. Electronically Signed   By: Wiliam Ke M.D.   On: 07/05/2021 00:00    Procedures Procedures   Medications Ordered in ED Medications  sodium chloride flush (NS) 0.9 % injection 3 mL (3 mLs Intravenous Not Given 07/05/21 0038)    ED Course  I have reviewed the triage vital signs and the nursing notes.  Pertinent labs & imaging results that were available during my care of the patient were reviewed by me and considered in my medical decision making (see chart for details).    MDM Rules/Calculators/A&P                           Patient presented as a code stroke.  Last known well at 1800.  It was reported that his initial NIH stroke score was 9 that decreased to 5.  He is now at baseline and his symptoms are completely resolved. Discussed with Dr. Thomasena Edis with neurology.  He request admission to the hospitalist for TIA 1:17 AM Discussed with Dr. Leafy Half for admission Final Clinical Impression(s) / ED Diagnoses Final diagnoses:  TIA (transient ischemic attack)    Rx / DC Orders ED Discharge Orders     None        Zadie Rhine, MD 07/05/21 0117

## 2021-07-05 NOTE — Plan of Care (Signed)
  Problem: Education: Goal: Knowledge of General Education information will improve Description: Including pain rating scale, medication(s)/side effects and non-pharmacologic comfort measures Outcome: Progressing   Problem: Health Behavior/Discharge Planning: Goal: Ability to manage health-related needs will improve Outcome: Progressing   Problem: Clinical Measurements: Goal: Ability to maintain clinical measurements within normal limits will improve Outcome: Progressing Goal: Will remain free from infection Outcome: Progressing Goal: Diagnostic test results will improve Outcome: Progressing Goal: Respiratory complications will improve Outcome: Progressing Goal: Cardiovascular complication will be avoided Outcome: Progressing   Problem: Activity: Goal: Risk for activity intolerance will decrease Outcome: Progressing   Problem: Nutrition: Goal: Adequate nutrition will be maintained Outcome: Progressing   Problem: Coping: Goal: Level of anxiety will decrease Outcome: Progressing   Problem: Elimination: Goal: Will not experience complications related to bowel motility Outcome: Progressing Goal: Will not experience complications related to urinary retention Outcome: Progressing   Problem: Pain Managment: Goal: General experience of comfort will improve Outcome: Progressing   Problem: Safety: Goal: Ability to remain free from injury will improve Outcome: Progressing   Problem: Skin Integrity: Goal: Risk for impaired skin integrity will decrease Outcome: Progressing   Problem: Education: Goal: Knowledge of disease or condition will improve Outcome: Progressing Goal: Knowledge of secondary prevention will improve (SELECT ALL) Outcome: Progressing Goal: Knowledge of patient specific risk factors will improve (INDIVIDUALIZE FOR PATIENT) Outcome: Progressing Goal: Individualized Educational Video(s) Outcome: Progressing   Problem: Coping: Goal: Will verbalize  positive feelings about self Outcome: Progressing Goal: Will identify appropriate support needs Outcome: Progressing   Problem: Health Behavior/Discharge Planning: Goal: Ability to manage health-related needs will improve Outcome: Progressing   Problem: Self-Care: Goal: Ability to participate in self-care as condition permits will improve Outcome: Progressing Goal: Verbalization of feelings and concerns over difficulty with self-care will improve Outcome: Progressing Goal: Ability to communicate needs accurately will improve Outcome: Progressing   Problem: Nutrition: Goal: Risk of aspiration will decrease Outcome: Progressing   Problem: Ischemic Stroke/TIA Tissue Perfusion: Goal: Complications of ischemic stroke/TIA will be minimized Outcome: Progressing   

## 2021-07-06 DIAGNOSIS — G459 Transient cerebral ischemic attack, unspecified: Secondary | ICD-10-CM | POA: Diagnosis not present

## 2021-07-06 DIAGNOSIS — R531 Weakness: Secondary | ICD-10-CM | POA: Diagnosis not present

## 2021-07-06 DIAGNOSIS — R030 Elevated blood-pressure reading, without diagnosis of hypertension: Secondary | ICD-10-CM | POA: Diagnosis not present

## 2021-07-06 DIAGNOSIS — F17210 Nicotine dependence, cigarettes, uncomplicated: Secondary | ICD-10-CM | POA: Diagnosis not present

## 2021-07-06 DIAGNOSIS — E041 Nontoxic single thyroid nodule: Secondary | ICD-10-CM

## 2021-07-06 LAB — COMPREHENSIVE METABOLIC PANEL
ALT: 27 U/L (ref 0–44)
AST: 27 U/L (ref 15–41)
Albumin: 3.8 g/dL (ref 3.5–5.0)
Alkaline Phosphatase: 71 U/L (ref 38–126)
Anion gap: 10 (ref 5–15)
BUN: 12 mg/dL (ref 8–23)
CO2: 26 mmol/L (ref 22–32)
Calcium: 9.4 mg/dL (ref 8.9–10.3)
Chloride: 103 mmol/L (ref 98–111)
Creatinine, Ser: 1.02 mg/dL (ref 0.61–1.24)
GFR, Estimated: >60 mL/min (ref >60)
Glucose, Bld: 118 mg/dL — ABNORMAL HIGH (ref 70–99)
Potassium: 3.9 mmol/L (ref 3.5–5.1)
Sodium: 139 mmol/L (ref 135–145)
Total Bilirubin: 0.8 mg/dL (ref 0.3–1.2)
Total Protein: 7.9 g/dL (ref 6.5–8.1)

## 2021-07-06 LAB — CBC WITH DIFFERENTIAL/PLATELET
Abs Immature Granulocytes: 0.02 10*3/uL (ref 0.00–0.07)
Basophils Absolute: 0 10*3/uL (ref 0.0–0.1)
Basophils Relative: 1 %
Eosinophils Absolute: 0.3 10*3/uL (ref 0.0–0.5)
Eosinophils Relative: 4 %
HCT: 42 % (ref 39.0–52.0)
Hemoglobin: 13.8 g/dL (ref 13.0–17.0)
Immature Granulocytes: 0 %
Lymphocytes Relative: 42 %
Lymphs Abs: 3.3 10*3/uL (ref 0.7–4.0)
MCH: 28 pg (ref 26.0–34.0)
MCHC: 32.9 g/dL (ref 30.0–36.0)
MCV: 85.2 fL (ref 80.0–100.0)
Monocytes Absolute: 0.7 10*3/uL (ref 0.1–1.0)
Monocytes Relative: 9 %
Neutro Abs: 3.5 10*3/uL (ref 1.7–7.7)
Neutrophils Relative %: 44 %
Platelets: 229 10*3/uL (ref 150–400)
RBC: 4.93 MIL/uL (ref 4.22–5.81)
RDW: 14.5 % (ref 11.5–15.5)
WBC: 7.8 10*3/uL (ref 4.0–10.5)
nRBC: 0 % (ref 0.0–0.2)

## 2021-07-06 LAB — GLUCOSE, CAPILLARY
Glucose-Capillary: 123 mg/dL — ABNORMAL HIGH (ref 70–99)
Glucose-Capillary: 105 mg/dL — ABNORMAL HIGH (ref 70–99)

## 2021-07-06 LAB — MAGNESIUM: Magnesium: 1.9 mg/dL (ref 1.7–2.4)

## 2021-07-06 LAB — PHOSPHORUS: Phosphorus: 3.6 mg/dL (ref 2.5–4.6)

## 2021-07-06 MED ORDER — ONDANSETRON HCL 4 MG PO TABS: 4.0000 mg | ORAL_TABLET | Freq: Four times a day (QID) | ORAL | 0 refills | Status: AC | PRN

## 2021-07-06 MED ORDER — INFLUENZA VAC SPLIT QUAD 0.5 ML IM SUSY
0.5000 mL | PREFILLED_SYRINGE | INTRAMUSCULAR | Status: AC
  Administered 2021-07-06: 0.5 mL via INTRAMUSCULAR
  Filled 2021-07-06: qty 0.5

## 2021-07-06 MED ORDER — ACETAMINOPHEN 325 MG PO TABS: 650.0000 mg | ORAL_TABLET | Freq: Four times a day (QID) | ORAL | 0 refills | Status: AC | PRN

## 2021-07-06 MED ORDER — ASPIRIN 81 MG PO TBEC: 81.0000 mg | DELAYED_RELEASE_TABLET | Freq: Every day | ORAL | 11 refills | Status: AC

## 2021-07-06 MED ORDER — CLOPIDOGREL BISULFATE 75 MG PO TABS: 75.0000 mg | ORAL_TABLET | Freq: Every day | ORAL | 0 refills | Status: AC

## 2021-07-06 MED ORDER — ATORVASTATIN CALCIUM 80 MG PO TABS: 80.0000 mg | ORAL_TABLET | Freq: Every day | ORAL | 0 refills | Status: AC

## 2021-07-06 NOTE — Progress Notes (Signed)
STROKE TEAM PROGRESS NOTE   INTERVAL HISTORY He is sitting comfortably in bedside chair. He states his left side numbness and weakness is almost back to normal,  CTA head and neck.  Shows no significant large vessel stenosis or occlusion.  CT head is stable without definite infarct noted.  No new neurological events overnight   Vitals:   07/05/21 2037 07/06/21 0046 07/06/21 0513 07/06/21 0803  BP: (!) 160/109 (!) 158/91 (!) 147/106 (!) 137/97  Pulse: 79 77 76 73  Resp: 18 16 16 20   Temp: 98.7 F (37.1 C) 98.3 F (36.8 C) 98.3 F (36.8 C) 97.9 F (36.6 C)  TempSrc: Oral Oral Oral   SpO2: 94% 97% 96% 94%  Weight:       CBC:  Recent Labs  Lab 07/05/21 0358 07/06/21 0346  WBC 8.5 7.8  NEUTROABS 5.0 3.5  HGB 13.8 13.8  HCT 40.8 42.0  MCV 84.5 85.2  PLT 241 229   Basic Metabolic Panel:  Recent Labs  Lab 07/05/21 0358 07/06/21 0346  NA 138 139  K 3.8 3.9  CL 103 103  CO2 26 26  GLUCOSE 128* 118*  BUN 8 12  CREATININE 0.93 1.02  CALCIUM 9.5 9.4  MG 1.8 1.9  PHOS  --  3.6   Lipid Panel:  Recent Labs  Lab 07/05/21 1003  CHOL 237*  TRIG 145  HDL 40*  CHOLHDL 5.9  VLDL 29  LDLCALC 07/07/21*   HgbA1c:  Recent Labs  Lab 07/05/21 0359  HGBA1C 6.2*   Urine Drug Screen: No results for input(s): LABOPIA, COCAINSCRNUR, LABBENZ, AMPHETMU, THCU, LABBARB in the last 168 hours.  Alcohol Level No results for input(s): ETH in the last 168 hours.  IMAGING past 24 hours CT ANGIO HEAD NECK W WO CM  Result Date: 07/05/2021 CLINICAL DATA:  Code stroke presentation yesterday. EXAM: CT ANGIOGRAPHY HEAD AND NECK TECHNIQUE: Multidetector CT imaging of the head and neck was performed using the standard protocol during bolus administration of intravenous contrast. Multiplanar CT image reconstructions and MIPs were obtained to evaluate the vascular anatomy. Carotid stenosis measurements (when applicable) are obtained utilizing NASCET criteria, using the distal internal carotid diameter  as the denominator. CONTRAST:  07/07/2021 OMNIPAQUE IOHEXOL 350 MG/ML SOLN COMPARISON:  07/04/2021 FINDINGS: CT HEAD FINDINGS Brain: No acute CT finding. Mild chronic small-vessel ischemic change of the cerebral hemispheric white matter. No mass lesion, hemorrhage, hydrocephalus or extra-axial collection. Vascular: There is atherosclerotic calcification of the major vessels at the base of the brain. Skull: Negative Sinuses: Clear Orbits: Normal Review of the MIP images confirms the above findings CTA NECK FINDINGS Aortic arch: Pronounced aortic atherosclerosis. Right carotid system: Common carotid artery is widely patent to the bifurcation. Carotid bifurcation is normal without soft or calcified plaque. Cervical ICA is normal. Left carotid system: Common carotid artery widely patent to the bifurcation. Carotid bifurcation shows minimal soft plaque but no stenosis. Cervical ICA is normal beyond the bifurcation. Vertebral arteries: Both vertebral artery origins are widely patent. Small amount of calcified plaque on the right 1 cm beyond the origin but no significant stenosis. Both vertebral arteries appear normal otherwise through the cervical region to the foramen magnum. The right does take a tortuous loop within the intervertebral foramen at C4-5. Skeleton: Ordinary cervical spondylosis. Other neck: No lymphadenopathy. Enlarged heterogeneous thyroid gland. Question of a 3.7 cm nodule at the lower pole on the left. Upper chest: Lung apices are clear. Review of the MIP images confirms the above findings  CTA HEAD FINDINGS Anterior circulation: Both internal carotid arteries are patent through the skull base and siphon regions. Minimal siphon atherosclerotic calcification but no stenosis. The anterior and middle cerebral vessels are patent. No large vessel occlusion or correctable proximal stenosis. Posterior circulation: Both vertebral arteries are widely patent to the basilar. No basilar stenosis. Posterior circulation  branch vessels are patent and unremarkable. Venous sinuses: Patent and normal. Anatomic variants: None significant. Review of the MIP images confirms the above findings IMPRESSION: No CT change since yesterday.  No sign of acute stroke. No large vessel occlusion or correctable proximal stenosis. No atherosclerotic disease of any significance at either carotid bifurcation. Aortic Atherosclerosis (ICD10-I70.0). Enlarged heterogeneous thyroid gland. Question of a 3.7 cm nodule at the lower pole of the left lobe of the thyroid. Recommend thyroid US (ref: J Am Coll Radiol. 2015 Feb;12(2): 143-50). Electronically Signed   By: Paulina Fusi M.D.   On: 07/05/2021 20:26    PHYSICAL EXAM  Physical Exam  Constitutional: Obese middle-aged African-American male not in distress Psych: Affect appropriate to situation Eyes: No scleral injection HENT: No OP obstrucion MSK: no joint deformities.  Cardiovascular: Normal rate and regular rhythm.  Respiratory: Effort normal, non-labored breathing GI: Soft.  No distension. There is no tenderness.  Skin: WDI  Neuro: Mental Status: Patient is awake, alert, oriented to person, place, month, year, and situation. Patient is able to give a clear and coherent history. No signs of aphasia or neglect Cranial Nerves: II: Visual Fields are full. Pupils are equal, round, and reactive to light.   III,IV, VI: EOMI without ptosis or diploplia.  V: Facial sensation is symmetric to temperature VII: Facial movement is symmetric.  VIII: hearing is intact to voice X: Uvula elevates symmetrically XI: Shoulder shrug is symmetric. XII: tongue is midline without atrophy or fasciculations.  Motor: Tone is normal. Bulk is normal. 5/5 strength was present in all four extremities.  Sensory: Sensation is symmetric to light touch and temperature in the arms and legs. Deep Tendon Reflexes: 2+ and symmetric in the biceps and patellae.  Plantars: Toes are downgoing bilaterally.   Cerebellar: FNF and HKS are intact bilaterally  ASSESSMENT/PLAN [name] is a 60 y.o. male with history of  untreated diabetes, HTN with transient left-sided deficits now resolved except for very mild decreased sensation to left upper extremity   Transient left-sided numbness episode possibly right brain TIA versus small infarct likely from small vessel disease  code Stroke CT head No acute abnormality. ASPECTS 10.    CTA head & neck no significant large vessel stenosis or occlusion.  MRI  unable to tolerate 2D Echo left ventricular ejection fraction 55 to 60%.  No cardiac source of embolism.   LDL 168 HgbA1c 6.2 VTE prophylaxis - SCD's lovenox    Diet   Diet heart healthy/carb modified Room service appropriate? Yes; Fluid consistency: Thin   No antithrombotic prior to admission, now on aspirin 81 mg daily and clopidogrel 75 mg daily.  Therapy recommendations:  pending Disposition:  probably home  Hypertension Home meds:  no meds  UnStable Permissive hypertension (OK if < 220/120) but gradually normalize in 5-7 days Long-term BP goal normotensive  Hyperlipidemia Home meds:  none,  LDL 168, goal < 70 Add atorvastatin 80mg    Continue statin at discharge   Other Stroke Risk Factors Cigarette smoker advised to stop smoking Obesity, BMI >/= 30 associated with increased stroke risk, recommend weight loss, diet and exercise as appropriate   Other  Active Problems Tobacco Use -counseled on cessation, declines nicotine patch  Hospital day # 0   Patient presented with transient left sided numbness which appears to be recovering and almost back to baseline likely right hemispheric TIA versus small infarct not visualized on CT.Continue ongoing stroke work-up.  Aggressive risk factor modification.  Recommend dual antiplatelet therapy aspirin and Plavix for 3 weeks followed by aspirin alone.  Greater than 50% time during this 25-minute visit was spent on counseling and  coordination of care about his stroke and TIA work-up discussion about secondary prevention and answering questions.  Follow-up with outpatient stroke clinic in 2 months discussed with Dr. Marland Mcalpine.  Stroke team will sign off.  Kindly call for questions  Delia Heady, MD Medical Director [facility] Pager: [phone] 07/06/2021 10:59 AM  To contact Stroke Continuity provider, please refer to [email or web address]. After hours, contact General Neurology

## 2021-07-06 NOTE — Discharge Summary (Signed)
Physician Discharge Summary  LENN VOLKER HYW:[id number] DOB: 11-Mar-1961 DOA: 07/04/2021  PCP: Oneita Hurt, No  Admit date: 07/04/2021 Discharge date: 07/06/2021  Admitted From: Home Disposition: Home with Outpatient PT  Recommendations for Outpatient Follow-up:  Follow up with PCP in 1-2 weeks Follow up with Neurology within 2 months and continue with dual antiplatelet therapy for 3 weeks and then ASA monotherapy  Check Thyroid U/S in the outpatient setting and workup Thyroid Nodule that was 3.7 cm at the lower pole of the Left Lower Lobe Please obtain CMP/CBC, Mag, Phos in one week Please follow up on the following pending results:  Home Health: No  Equipment/Devices: Agricultural consultant with 5" Wheels   Discharge Condition: Stable  CODE STATUS: FULL CODE Diet recommendation: Heart Healthy Carb Modified Diet  Brief/Interim Summary: The patient is a 60 year old obese male with a past medical history significant for but not limited to prediabetes as well as other comorbidities who presented with left-sided numbness and weakness.  Approximately around 8 PM after cooking steaks for himself and his daughter he was sitting down to eat recently noticed that his left hand has sensation of intense numbness.  Within several minutes the sensation was followed by almost complete paralysis of the left arm in addition to the left leg.  Patient also reported a left facial droop could not any headaches, changes in vision or difficulty with speech.  EMS was contacted who brought the patient to the ED for code stroke.  Upon arrival to the ED a stat CTA head was done without contrast and revealed no evidence of acute CVA.  Neurology evaluated the patient and noted the patient's neurological deficits were rapidly improving so tPA was not administered.  Patient was admitted to the hospital for his transient left-sided deficits and was admitted for TIA work-up  Discharge Diagnoses:  Principal Problem:   Acute  left-sided weakness Active Problems:   Elevated blood-pressure reading without diagnosis of hypertension   Prediabetes   Nicotine dependence, cigarettes, uncomplicated  Acute left-sided weakness with transient left-sided deficits and decreased sensation to the upper left extremity -Noncontrast CT imaging of the head in the ED reveals no evidence of acute stroke. -On exam most of patient's left-sided weakness has resolved however patient still exhibits a very slight left facial droop -Performing serial neurologic checks  -Continue to Monitor the patient on telemetry -Initiating aspirin therapy with 325 mg now followed by 81 mg daily.  Additionally initiating Plavix 75 mg daily for 3 weeks per Neurology Recc's and then ASA monotherapy  -Daily statin therapy to be initiated if LDL greater than 70 on lipid panel -Further imaging to include: MRI of the brain without contrast as well as MRA of the head and neck and these are pending to be done but patient refused so CTA of the Head an Neck was done instead at the recommendations of Neurology  -Head and Neck CTA showed "No CT change since yesterday.  No sign of acute stroke.  No large vessel occlusion or correctable proximal stenosis. No atherosclerotic disease of any significance at either carotid bifurcation. Aortic Atherosclerosis (ICD10-I70.0). Enlarged heterogeneous thyroid gland. Question of a 3.7 cm nodule at the lower pole of the left lobe of the thyroid. Recommend thyroid US." -Obtained Hemoglobin A1c and lipid panel -Echocardiogram ordered and done as below  -PT, OT, SLP evaluation and recommending outpatient PT -HbA1c was 6.2 -Permissive hypertension with as needed antihypertensives only to be given if blood pressure greater than 220/110 for the first 24  hours -Neurology following in consultation and appreciate Recc's   Elevated blood-pressure reading without diagnosis of hypertension -Patient exhibiting elevated blood pressures  throughout the emergency department stay -Patient states that he has "borderline hypertension" and has never been on medications before -Elevated blood pressures may be secondary to an acute stroke -Will allow for permissive hypertension for now and only provide hydralazine if blood pressures are higher than 220/110 for the first 24 hours -Continue monitor blood pressures per protocol last blood pressure reading was 147/105   Prediabetes -Patient reports history of prediabetes and is currently not on therapy -Patient is notably hyperglycemic on initial chemistry -Due to hyperglycemia will initiate before every meal and nightly Accu-Cheks with sliding scale insulin -Obtaining hemoglobin A1c and this was 6.2 -Continue to monitor CBGs per protocol: Last blood sugar was 118 on a.m. CMP   Nicotine dependence, cigarettes, uncomplicated -Counseling on cessation -Patient declines nicotine replacement therapy -Follow-up in outpatient setting with PCP   Hyperlipidemia -Lipid Panel done and showed a total cholesterol HDL ratio of 5.9, cholesterol of 237, HDL 40, LDL 168, triglycerides 145, VLDL of 29  Hypokalemia -Patient's potassium was 3.4 and improved to 3.9 -Continue monitor and replete as necessary; mag level was 1.8 -Repeat CMP in the AM  Thyroid Nodule -Question of a 3.7 cm nodule at the lower pole of the left lobe of the thyroid was noted on Head and Neck CTA -TSH was 2.324 -Check Thyroid U/S in the outpatient setting    Obesity -Complicates overall prognosis and care -There is no height or weight on file to calculate BMI. -Weight Loss and Dietary Counseling given   Discharge Instructions  Discharge Instructions     Call MD for:  difficulty breathing, headache or visual disturbances   Complete by: As directed    Call MD for:  extreme fatigue   Complete by: As directed    Call MD for:  hives   Complete by: As directed    Call MD for:  persistant dizziness or light-headedness    Complete by: As directed    Call MD for:  persistant nausea and vomiting   Complete by: As directed    Call MD for:  redness, tenderness, or signs of infection (pain, swelling, redness, odor or green/yellow discharge around incision site)   Complete by: As directed    Call MD for:  severe uncontrolled pain   Complete by: As directed    Call MD for:  temperature >100.4   Complete by: As directed    Diet - low sodium heart healthy   Complete by: As directed    Diet Carb Modified   Complete by: As directed    Discharge instructions   Complete by: As directed    You were cared for by a hospitalist during your hospital stay. If you have any questions about your discharge medications or the care you received while you were in the hospital after you are discharged, you can call the unit and ask to speak with the hospitalist on call if the hospitalist that took care of you is not available. Once you are discharged, your primary care physician will handle any further medical issues. Please note that NO REFILLS for any discharge medications will be authorized once you are discharged, as it is imperative that you return to your primary care physician (or establish a relationship with a primary care physician if you do not have one) for your aftercare needs so that they can reassess your need for  medications and monitor your lab values.  Follow up with PCP and Neurology within 1-2 weeks. Take all medications as prescribed. If symptoms change or worsen please return to the ED for evaluation   Increase activity slowly   Complete by: As directed       Allergies as of 07/06/2021   Not on File      Medication List     TAKE these medications    acetaminophen 325 MG tablet Commonly known as: TYLENOL Take 2 tablets (650 mg total) by mouth every 6 (six) hours as needed for mild pain (or Fever >/= 101).   aspirin 81 MG EC tablet Take 1 tablet (81 mg total) by mouth daily. Swallow whole. Start  taking on: July 07, 2021   atorvastatin 80 MG tablet Commonly known as: LIPITOR Take 1 tablet (80 mg total) by mouth daily. Start taking on: July 07, 2021   clopidogrel 75 MG tablet Commonly known as: PLAVIX Take 1 tablet (75 mg total) by mouth daily. Start taking on: July 07, 2021   ondansetron 4 MG tablet Commonly known as: ZOFRAN Take 1 tablet (4 mg total) by mouth every 6 (six) hours as needed for nausea.               Durable Medical Equipment  (From admission, onward)           Start     Ordered   07/06/21 1226  For home use only DME Walker rolling  Once       Question Answer Comment  Walker: With 5 Inch Wheels   Patient needs a walker to treat with the following condition TIA (transient ischemic attack)      07/06/21 1226            Follow-up Information     [facility], [facility] Follow up.   Why: [facility] [phone] Contact information: [address] [place] [address] [phone]                Not on File  Consultations: Neurology  Procedures/Studies: CT ANGIO HEAD NECK W WO CM  Result Date: 07/05/2021 CLINICAL DATA:  Code stroke presentation yesterday. EXAM: CT ANGIOGRAPHY HEAD AND NECK TECHNIQUE: Multidetector CT imaging of the head and neck was performed using the standard protocol during bolus administration of intravenous contrast. Multiplanar CT image reconstructions and MIPs were obtained to evaluate the vascular anatomy. Carotid stenosis measurements (when applicable) are obtained utilizing NASCET criteria, using the distal internal carotid diameter as the denominator. CONTRAST:  OMNIPAQUE IOHEXOL 350 MG/ML SOLN COMPARISON:  07/04/2021 FINDINGS: CT HEAD FINDINGS Brain: No acute CT finding. Mild chronic small-vessel ischemic change of the cerebral hemispheric white matter. No mass lesion, hemorrhage, hydrocephalus or extra-axial collection. Vascular: There is atherosclerotic  calcification of the major vessels at the base of the brain. Skull: Negative Sinuses: Clear Orbits: Normal Review of the MIP images confirms the above findings CTA NECK FINDINGS Aortic arch: Pronounced aortic atherosclerosis. Right carotid system: Common carotid artery is widely patent to the bifurcation. Carotid bifurcation is normal without soft or calcified plaque. Cervical ICA is normal. Left carotid system: Common carotid artery widely patent to the bifurcation. Carotid bifurcation shows minimal soft plaque but no stenosis. Cervical ICA is normal beyond the bifurcation. Vertebral arteries: Both vertebral artery origins are widely patent. Small amount of calcified plaque on the right 1 cm beyond the origin but no significant stenosis. Both vertebral arteries appear normal otherwise through the  cervical region to the foramen magnum. The right does take a tortuous loop within the intervertebral foramen at C4-5. Skeleton: Ordinary cervical spondylosis. Other neck: No lymphadenopathy. Enlarged heterogeneous thyroid gland. Question of a 3.7 cm nodule at the lower pole on the left. Upper chest: Lung apices are clear. Review of the MIP images confirms the above findings CTA HEAD FINDINGS Anterior circulation: Both internal carotid arteries are patent through the skull base and siphon regions. Minimal siphon atherosclerotic calcification but no stenosis. The anterior and middle cerebral vessels are patent. No large vessel occlusion or correctable proximal stenosis. Posterior circulation: Both vertebral arteries are widely patent to the basilar. No basilar stenosis. Posterior circulation branch vessels are patent and unremarkable. Venous sinuses: Patent and normal. Anatomic variants: None significant. Review of the MIP images confirms the above findings IMPRESSION: No CT change since yesterday.  No sign of acute stroke. No large vessel occlusion or correctable proximal stenosis. No atherosclerotic disease of any  significance at either carotid bifurcation. Aortic Atherosclerosis (ICD10-I70.0). Enlarged heterogeneous thyroid gland. Question of a 3.7 cm nodule at the lower pole of the left lobe of the thyroid. Recommend thyroid US (ref: J Am Coll Radiol. 2015 Feb;12(2): 143-50). Electronically Signed   By: Paulina Fusi M.D.   On: 07/05/2021 20:26   ECHOCARDIOGRAM COMPLETE BUBBLE STUDY  Result Date: 07/05/2021    ECHOCARDIOGRAM REPORT   Patient Name:   Anthony Joseph Date of Exam: 07/05/2021 Medical Rec #:  [id number]        Height: Accession #:    [id number]       Weight:       260.4 lb Date of Birth:  1961-02-04        BSA:          2.408 m Patient Age:    60 years         BP:           148/94 mmHg Patient Gender: M                HR:           73 bpm. Exam Location:  Inpatient Procedure: 2D Echo, Cardiac Doppler, Color Doppler and Saline Contrast Bubble            Study Indications:    Stroke  History:        Patient has no prior history of Echocardiogram examinations.                 Risk Factors:pre-diabetes.  Sonographer:    Gertie Fey MHA, RDMS, RVT, RDCS Referring Phys: [id number] Deno Lunger [facility]  Sonographer Comments: Image acquisition challenging due to patient body habitus and hypersomnolence, restricted mobility. IMPRESSIONS  1. Left ventricular ejection fraction, by estimation, is 55 to 60%. The left ventricle has normal function. The left ventricle has no regional wall motion abnormalities. Left ventricular diastolic parameters are indeterminate.  2. Right ventricular systolic function is normal. The right ventricular size is normal. There is normal pulmonary artery systolic pressure.  3. The mitral valve is grossly normal. No evidence of mitral valve regurgitation. No evidence of mitral stenosis.  4. The aortic valve is grossly normal. There is mild calcification of the aortic valve. Aortic valve regurgitation is trivial. Mild aortic valve sclerosis is present, with no evidence of aortic valve  stenosis.  5. The inferior vena cava is normal in size with greater than 50% respiratory variability, suggesting right atrial pressure of 3 mmHg.  6. Agitated saline  contrast bubble study was negative, with no evidence of any interatrial shunt. Comparison(s): No prior Echocardiogram. Conclusion(s)/Recommendation(s): Normal biventricular function without evidence of hemodynamically significant valvular heart disease. No intracardiac source of embolism detected on this transthoracic study. A transesophageal echocardiogram is recommended to exclude cardiac source of embolism if clinically indicated. FINDINGS  Left Ventricle: Left ventricular ejection fraction, by estimation, is 55 to 60%. The left ventricle has normal function. The left ventricle has no regional wall motion abnormalities. The left ventricular internal cavity size was normal in size. There is  no left ventricular hypertrophy. Left ventricular diastolic parameters are indeterminate. Right Ventricle: The right ventricular size is normal. Right vetricular wall thickness was not well visualized. Right ventricular systolic function is normal. There is normal pulmonary artery systolic pressure. The tricuspid regurgitant velocity is 1.83 m/s, and with an assumed right atrial pressure of 3 mmHg, the estimated right ventricular systolic pressure is 16.4 mmHg. Left Atrium: Left atrial size was normal in size. Right Atrium: Right atrial size was normal in size. Pericardium: There is no evidence of pericardial effusion. Presence of pericardial fat pad. Mitral Valve: The mitral valve is grossly normal. No evidence of mitral valve regurgitation. No evidence of mitral valve stenosis. Tricuspid Valve: The tricuspid valve is normal in structure. Tricuspid valve regurgitation is trivial. No evidence of tricuspid stenosis. Aortic Valve: The aortic valve is grossly normal. There is mild calcification of the aortic valve. Aortic valve regurgitation is trivial. Mild aortic  valve sclerosis is present, with no evidence of aortic valve stenosis. Aortic valve mean gradient measures 5.5 mmHg. Aortic valve peak gradient measures 9.6 mmHg. Aortic valve area, by VTI measures 2.08 cm. Pulmonic Valve: The pulmonic valve was not well visualized. Pulmonic valve regurgitation is not visualized. No evidence of pulmonic stenosis. Aorta: The aortic root, ascending aorta and aortic arch are all structurally normal, with no evidence of dilitation or obstruction. Venous: The inferior vena cava is normal in size with greater than 50% respiratory variability, suggesting right atrial pressure of 3 mmHg. IAS/Shunts: No atrial level shunt detected by color flow Doppler. Agitated saline contrast was given intravenously to evaluate for intracardiac shunting. Agitated saline contrast bubble study was negative, with no evidence of any interatrial shunt.  LEFT VENTRICLE PLAX 2D LVIDd:         4.90 cm   Diastology LVIDs:         3.45 cm   LV e' medial:    6.42 cm/s LV PW:         1.00 cm   LV E/e' medial:  15.8 LV IVS:        0.95 cm   LV e' lateral:   11.70 cm/s LVOT diam:     1.75 cm   LV E/e' lateral: 8.7 LV SV:         59 LV SV Index:   24 LVOT Area:     2.41 cm  RIGHT VENTRICLE RV S prime:     15.90 cm/s TAPSE (M-mode): 2.1 cm LEFT ATRIUM             Index        RIGHT ATRIUM           Index LA diam:        3.35 cm 1.39 cm/m   RA Area:     11.30 cm LA Vol (A2C):   67.0 ml 27.83 ml/m  RA Volume:   21.00 ml  8.72 ml/m LA Vol (A4C):   57.8  ml 24.01 ml/m LA Biplane Vol: 67.8 ml 28.16 ml/m  AORTIC VALVE AV Area (Vmax):    2.02 cm AV Area (Vmean):   1.78 cm AV Area (VTI):     2.08 cm AV Vmax:           155.00 cm/s AV Vmean:          103.200 cm/s AV VTI:            0.282 m AV Peak Grad:      9.6 mmHg AV Mean Grad:      5.5 mmHg LVOT Vmax:         130.00 cm/s LVOT Vmean:        76.500 cm/s LVOT VTI:          0.244 m LVOT/AV VTI ratio: 0.87  AORTA Ao Root diam: 2.60 cm MITRAL VALVE                TRICUSPID  VALVE MV Area (PHT): 2.36 cm     TR Peak grad:   13.4 mmHg MV Decel Time: 322 msec     TR Vmax:        183.00 cm/s MR Peak grad: 5.6 mmHg MR Vmax:      118.00 cm/s   SHUNTS MV E velocity: 101.67 cm/s  Systemic VTI:  0.24 m MV A velocity: 102.00 cm/s  Systemic Diam: 1.75 cm MV E/A ratio:  1.00 Jodelle Red MD Electronically signed by Jodelle Red MD Signature Date/Time: 07/05/2021/1:03:05 PM    Final    CT HEAD CODE STROKE WO CONTRAST  Result Date: 07/05/2021 CLINICAL DATA:  Code stroke.  Neuro deficit, stroke suspected EXAM: CT HEAD WITHOUT CONTRAST TECHNIQUE: Contiguous axial images were obtained from the base of the skull through the vertex without intravenous contrast. COMPARISON:  None. FINDINGS: Brain: No evidence of acute infarction, hemorrhage, cerebral edema, mass, mass effect, or midline shift. No extra-axial fluid collection. Vascular: No hyperdense vessel or unexpected calcification. Skull: Normal. Negative for fracture or focal lesion. Sinuses/Orbits: No acute finding. Other: The mastoid air cells are well aerated. ASPECTS [facility] Stroke Program Early CT Score) - Ganglionic level infarction (caudate, lentiform nuclei, internal capsule, insula, M1-M3 cortex): 7 - Supraganglionic infarction (M4-M6 cortex): 3 Total score (0-10 with 10 being normal): 10 IMPRESSION: 1. No acute intracranial process. 2. ASPECTS is 10 Code stroke imaging results were communicated on 07/04/2021 at 11:58 pm to provider [facility] via secure text paging. Electronically Signed   By: Wiliam Ke M.D.   On: 07/05/2021 00:00     Subjective: Seen and examined at bedside and patient was doing fairly well and wanted to go home.  Had no complaints and symptoms are back to his baseline.  Did not rest as well but ready to go home.  Denies any other concerns or complaints at this time.  Discharge Exam: Vitals:   07/06/21 0803 07/06/21 1230  BP: (!) 137/97 (!) 147/105  Pulse: 73 90  Resp: 20 20  Temp: 97.9  F (36.6 C) 98.7 F (37.1 C)  SpO2: 94% 95%   Vitals:   07/06/21 0046 07/06/21 0513 07/06/21 0803 07/06/21 1230  BP: (!) 158/91 (!) 147/106 (!) 137/97 (!) 147/105  Pulse: 77 76 73 90  Resp: Temp: 98.3 F (36.8 C) 98.3 F (36.8 C) 97.9 F (36.6 C) 98.7 F (37.1 C)  TempSrc: Oral Oral  Oral  SpO2: 97% 96% 94% 95%  Weight:       General:  Pt is alert, awake, not in acute distress Cardiovascular: RRR, S1/S2 +, no rubs, no gallops Respiratory: Diminished bilaterally, no wheezing, no rhonchi Abdominal: Soft, NT, distended secondary body habitus, bowel sounds + Extremities: no edema, no cyanosis  The results of significant diagnostics from this hospitalization (including imaging, microbiology, ancillary and laboratory) are listed below for reference.    Microbiology: Recent Results (from the past 240 hour(s))  Resp Panel by RT-PCR (Flu A&B, Covid) Nasopharyngeal Swab     Status: None   Collection Time: 07/05/21  3:04 AM   Specimen: Nasopharyngeal Swab; Nasopharyngeal(NP) swabs in vial transport medium  Result Value Ref Range Status   SARS Coronavirus 2 by RT PCR NEGATIVE NEGATIVE Final    Comment: (NOTE) SARS-CoV-2 target nucleic acids are NOT DETECTED.  The SARS-CoV-2 RNA is generally detectable in upper respiratory specimens during the acute phase of infection. The lowest concentration of SARS-CoV-2 viral copies this assay can detect is 138 copies/mL. A negative result does not preclude SARS-Cov-2 infection and should not be used as the sole basis for treatment or other patient management decisions. A negative result may occur with  improper specimen collection/handling, submission of specimen other than nasopharyngeal swab, presence of viral mutation(s) within the areas targeted by this assay, and inadequate number of viral copies(<138 copies/mL). A negative result must be combined with clinical observations, patient history, and epidemiological information.  The expected result is Negative.  Fact Sheet for Patients:  BloggerCourse.com  Fact Sheet for Healthcare Providers:  SeriousBroker.it  This test is no t yet approved or cleared by the [place] FDA and  has been authorized for detection and/or diagnosis of SARS-CoV-2 by FDA under an Emergency Use Authorization (EUA). This EUA will remain  in effect (meaning this test can be used) for the duration of the COVID-19 declaration under Section 564(b)(1) of the Act, 21 U.S.C.section 360bbb-3(b)(1), unless the authorization is terminated  or revoked sooner.       Influenza A by PCR NEGATIVE NEGATIVE Final   Influenza B by PCR NEGATIVE NEGATIVE Final    Comment: (NOTE) The Xpert Xpress SARS-CoV-2/FLU/RSV plus assay is intended as an aid in the diagnosis of influenza from Nasopharyngeal swab specimens and should not be used as a sole basis for treatment. Nasal washings and aspirates are unacceptable for Xpert Xpress SARS-CoV-2/FLU/RSV testing.  Fact Sheet for Patients: BloggerCourse.com  Fact Sheet for Healthcare Providers: SeriousBroker.it  This test is not yet approved or cleared by the [place] FDA and has been authorized for detection and/or diagnosis of SARS-CoV-2 by FDA under an Emergency Use Authorization (EUA). This EUA will remain in effect (meaning this test can be used) for the duration of the COVID-19 declaration under Section 564(b)(1) of the Act, 21 U.S.C. section 360bbb-3(b)(1), unless the authorization is terminated or revoked.  Performed at [facility], [address] [place], [place] [address]     Labs: BNP (last 3 results) No results for input(s): BNP in the last 8760 hours. Basic Metabolic Panel: Recent Labs  Lab 07/04/21 2346 07/04/21 2350 07/05/21 0358 07/06/21 0346  NA 137 141 138 139  K 3.4* 3.4* 3.8 3.9  CL 103 102 103 103  CO2 24   --  26 26  GLUCOSE 160* 161* 128* 118*  BUN CREATININE 0.93 0.90 0.93 1.02  CALCIUM 9.6  --  9.5 9.4  MG  --   --  1.8 1.9  PHOS  --   --   --  3.6   Liver Function Tests: Recent Labs  Lab 07/04/21 2346 07/05/21 0358 07/06/21 0346  AST ALT ALKPHOS 77 80 71  BILITOT 0.8 0.8 0.8  PROT 8.0 7.7 7.9  ALBUMIN 3.9 3.7 3.8   No results for input(s): LIPASE, AMYLASE in the last 168 hours. No results for input(s): AMMONIA in the last 168 hours. CBC: Recent Labs  Lab 07/04/21 2346 07/04/21 2350 07/05/21 0358 07/06/21 0346  WBC 8.8  --  8.5 7.8  NEUTROABS 3.9  --  5.0 3.5  HGB 13.8 15.0 13.8 13.8  HCT 41.7 44.0 40.8 42.0  MCV 84.9  --  84.5 85.2  PLT 242  --  241 229   Cardiac Enzymes: No results for input(s): CKTOTAL, CKMB, CKMBINDEX, TROPONINI in the last 168 hours. BNP: Invalid input(s): POCBNP CBG: Recent Labs  Lab 07/05/21 1109 07/05/21 1744 07/05/21 2032 07/06/21 0557 07/06/21 1220  GLUCAP 129* 124* 126* 123* 105*   D-Dimer No results for input(s): DDIMER in the last 72 hours. Hgb A1c Recent Labs    07/05/21 0359  HGBA1C 6.2*   Lipid Profile Recent Labs    07/05/21 1003  CHOL 237*  HDL 40*  LDLCALC 168*  TRIG 145  CHOLHDL 5.9   Thyroid function studies Recent Labs    07/05/21 0359  TSH 2.324   Anemia work up No results for input(s): VITAMINB12, FOLATE, FERRITIN, TIBC, IRON, RETICCTPCT in the last 72 hours. Urinalysis No results found for: COLORURINE, APPEARANCEUR, LABSPEC, PHURINE, GLUCOSEU, HGBUR, BILIRUBINUR, KETONESUR, PROTEINUR, UROBILINOGEN, NITRITE, LEUKOCYTESUR Sepsis Labs Invalid input(s): PROCALCITONIN,  WBC,  LACTICIDVEN Microbiology Recent Results (from the past 240 hour(s))  Resp Panel by RT-PCR (Flu A&B, Covid) Nasopharyngeal Swab     Status: None   Collection Time: 07/05/21  3:04 AM   Specimen: Nasopharyngeal Swab; Nasopharyngeal(NP) swabs in vial transport medium  Result Value Ref Range Status    SARS Coronavirus 2 by RT PCR NEGATIVE NEGATIVE Final    Comment: (NOTE) SARS-CoV-2 target nucleic acids are NOT DETECTED.  The SARS-CoV-2 RNA is generally detectable in upper respiratory specimens during the acute phase of infection. The lowest concentration of SARS-CoV-2 viral copies this assay can detect is 138 copies/mL. A negative result does not preclude SARS-Cov-2 infection and should not be used as the sole basis for treatment or other patient management decisions. A negative result may occur with  improper specimen collection/handling, submission of specimen other than nasopharyngeal swab, presence of viral mutation(s) within the areas targeted by this assay, and inadequate number of viral copies(<138 copies/mL). A negative result must be combined with clinical observations, patient history, and epidemiological information. The expected result is Negative.  Fact Sheet for Patients:  BloggerCourse.com  Fact Sheet for Healthcare Providers:  SeriousBroker.it  This test is no t yet approved or cleared by the [place] FDA and  has been authorized for detection and/or diagnosis of SARS-CoV-2 by FDA under an Emergency Use Authorization (EUA). This EUA will remain  in effect (meaning this test can be used) for the duration of the COVID-19 declaration under Section 564(b)(1) of the Act, 21 U.S.C.section 360bbb-3(b)(1), unless the authorization is terminated  or revoked sooner.       Influenza A by PCR NEGATIVE NEGATIVE Final   Influenza B by PCR NEGATIVE NEGATIVE Final    Comment: (NOTE) The Xpert Xpress SARS-CoV-2/FLU/RSV plus assay is intended as an aid in the diagnosis of influenza from Nasopharyngeal swab specimens and should not  be used as a sole basis for treatment. Nasal washings and aspirates are unacceptable for Xpert Xpress SARS-CoV-2/FLU/RSV testing.  Fact Sheet for  Patients: BloggerCourse.com  Fact Sheet for Healthcare Providers: SeriousBroker.it  This test is not yet approved or cleared by the [place] FDA and has been authorized for detection and/or diagnosis of SARS-CoV-2 by FDA under an Emergency Use Authorization (EUA). This EUA will remain in effect (meaning this test can be used) for the duration of the COVID-19 declaration under Section 564(b)(1) of the Act, 21 U.S.C. section 360bbb-3(b)(1), unless the authorization is terminated or revoked.  Performed at [facility], [address], [place], [place] [address]    Time coordinating discharge: 35 minutes  SIGNED:  Merlene Laughter, DO [facility] 07/06/2021, 5:28 PM Pager is on AMION  If 7PM-7AM, please contact night-coverage [email or web address]

## 2021-07-06 NOTE — Progress Notes (Signed)
Physical Therapy Treatment Patient Details Name: Anthony Joseph MRN: [id number] DOB: Jun 04, 1961 Today's Date: 07/06/2021   History of Present Illness Pt is a 60 y/o male admitted 10/23 secondary to LLE weakness. CT negative. Further workup pending. PMH includes DM and HTN.    PT Comments    Pt received in supine, agreeable to PT session with encouragement (pt confused because he had recently worked with OT previously but participatory with encouragement). Pt with improved standing/gait tolerance and improved LLE awareness/strength this date, pt using cane intermittently during hallway gait trial and stair negotiation. Pt needing up to Supervision for gait tasks in room/hallway, he is slightly more stable with cane with improved LLE step length so encouraged him to use a cane for longer distances and ambulation outside of the home until he feels "back to baseline" as he admits to feeling "85% better" this date. Emphasis on safety with transfers/gait, use of assistive device, safe stair sequencing, healthy lifestyle choices to reduce risk of future strokes (smoking cessation) and activity pacing. Pt given HEP handout for exercises to perform at home as he admits he may not follow up on outpatient PT. Pt continues to benefit from PT services to progress toward functional mobility goals. Continue to recommend OPPT.   Recommendations for follow up therapy are one component of a multi-disciplinary discharge planning process, led by the attending physician.  Recommendations may be updated based on patient status, additional functional criteria and insurance authorization.  Follow Up Recommendations  Outpatient PT     Assistance Recommended at Discharge Intermittent Supervision/Assistance  Equipment Recommendations  Gilmer Mor (pt reports he owns cane, defers RW)    Recommendations for Other Services       Precautions / Restrictions Precautions Precautions: Fall Restrictions Weight Bearing  Restrictions: No     Mobility  Bed Mobility Overal bed mobility: Needs Assistance;Modified Independent Bed Mobility: Supine to Sit     Supine to sit: Modified independent (Device/Increase time)     General bed mobility comments: mod I no assist needed, HOB slightly elevated    Transfers Overall transfer level: Modified independent Equipment used: None Transfers: Sit to/from Stand Sit to Stand: Modified independent (Device/Increase time)           General transfer comment: from EOB    Ambulation/Gait Ambulation/Gait assistance: Supervision Gait Distance (Feet): 200 Feet Assistive device: Straight cane Gait Pattern/deviations: Step-through pattern;Decreased step length - left;Decreased dorsiflexion - left Gait velocity: Decreased Gait velocity interpretation: 1.31 - 2.62 ft/sec, indicative of limited community ambulator General Gait Details: Supervision due to occasional cues for safe use of cane and step sequencing, but much improved upright posture and LLE awareness this date, no buckling observed. Pt tending to carry cane instead of utilizing it at times, encouraged him to use cane at home and especially out of doors initially due to slightly decreased L step length and dorsiflexion when he is distracted   Stairs Stairs: Yes Stairs assistance: Supervision;Min guard Stair Management: No rails;Alternating pattern;Step to pattern;Forwards;With cane Number of Stairs: 7 (six 4" steps with cane then one 7" platform step with cane) General stair comments: reciprocal pattern when ascending smaller steps with only cane for support and pt tending not to use cane unless cued, then step-to pattern for taller step practice using cane and cues for safe sequencing. Decreased carryover of initial cues for step-to pattern but no LOB or buckling observed.   Wheelchair Mobility    Modified Rankin (Stroke Patients Only) Modified Rankin (Stroke Patients Only) Pre-Morbid Rankin  Score:  No symptoms Modified Rankin: Moderate disability     Balance Overall balance assessment: Needs assistance Sitting-balance support: No upper extremity supported;Feet supported Sitting balance-Leahy Scale: Good     Standing balance support: No upper extremity supported;During functional activity Standing balance-Leahy Scale: Good Standing balance comment: no LOB or buckling with and without cane, supervision at most          Cognition Arousal/Alertness: Awake/alert Behavior During Therapy: WFL for tasks assessed/performed Overall Cognitive Status: Within Functional Limits for tasks assessed           General Comments: Pt admits he will "probably" be non-compliant with some health recommendations including not smoking and likely will refuse OPPT so printed HEP handout to reinforce strengthening/stability exercises. Fair safety awareness during session but ignoring some therapist cues, pt self-directed.        Exercises Other Exercises Other Exercises: HEP: [email or web address]    Access Code: [email or web address] Other Exercises: verbal/visual review for standing/seated LE exercises, handout given to reinforce pt receptive to instruction    General Comments General comments (skin integrity, edema, etc.): no acute s/sx distress throughout, VSS per chart review and not further assessed.      Pertinent Vitals/Pain Pain Assessment: No/denies pain    Home Living Family/patient expects to be discharged to:: Private residence Living Arrangements: Spouse/significant other;Children Available Help at Discharge: Family Type of Home: House Home Access: Stairs to enter Entrance Stairs-Rails: None Secretary/administrator of Steps: 2   Home Layout: One level Home Equipment: Cane - single point          PT Goals (current goals can now be found in the care plan section) Acute Rehab PT Goals Patient Stated Goal: to go home PT Goal Formulation: With patient Time For Goal Achievement:  07/19/21 Potential to Achieve Goals: Good Progress towards PT goals: Progressing toward goals    Frequency    Min 4X/week      PT Plan Current plan remains appropriate       AM-PAC PT "6 Clicks" Mobility   Outcome Measure  Help needed turning from your back to your side while in a flat bed without using bedrails?: None Help needed moving from lying on your back to sitting on the side of a flat bed without using bedrails?: None Help needed moving to and from a bed to a chair (including a wheelchair)?: None Help needed standing up from a chair using your arms (e.g., wheelchair or bedside chair)?: None Help needed to walk in hospital room?: A Little Help needed climbing 3-5 steps with a railing? : A Little 6 Click Score: 22    End of Session Equipment Utilized During Treatment: Gait belt Activity Tolerance: Patient tolerated treatment well Patient left: in bed;with call bell/phone within reach (seated EOB) Nurse Communication: Mobility status PT Visit Diagnosis: Unsteadiness on feet (R26.81);Muscle weakness (generalized) (M62.81)     Time: 5366-4403 PT Time Calculation (min) (ACUTE ONLY): 16 min  Charges:  $Gait Training: 8-22 mins                     Zaida Reiland P., PTA Acute Rehabilitation Services Pager: [phone] Office: [phone]    Angus Palms 07/06/2021, 12:39 PM

## 2021-07-06 NOTE — TOC Transition Note (Signed)
Transition of Care [facility]) - CM/SW Discharge Note   Patient Details  Name: Anthony Joseph MRN: [id number] Date of Birth: [birth year]  [facility] CM/SW Contact:  Kermit Balo, RN Phone Number: 07/06/2021, 11:55 AM   Clinical Narrative:    Patient is discharging home with self care. Pt is refusing outpatient therapy and states he has a walker at home.  Pt feels he can afford the d/c meds. No PcP or insurance. CM has placed information for Clinics in [place] that will accept him not having insurance.  Pt denies issues with transportation.  Wife to transport him home today.    Final next level of care: Home/Self Care Barriers to Discharge: Inadequate or no insurance, Barriers Unresolved (comment)   Patient Goals and CMS Choice        Discharge Placement                       Discharge Plan and Services                                     Social Determinants of Health (SDOH) Interventions     Readmission Risk Interventions No flowsheet data found.

## 2021-07-06 NOTE — Plan of Care (Signed)
Problem: Education: Goal: Knowledge of General Education information will improve Description: Including pain rating scale, medication(s)/side effects and non-pharmacologic comfort measures 07/06/2021 1436 by Merrilee Seashore, RN Outcome: Adequate for Discharge 07/06/2021 1436 by Merrilee Seashore, RN Outcome: Progressing   Problem: Health Behavior/Discharge Planning: Goal: Ability to manage health-related needs will improve 07/06/2021 1436 by Merrilee Seashore, RN Outcome: Adequate for Discharge 07/06/2021 1436 by Merrilee Seashore, RN Outcome: Progressing   Problem: Clinical Measurements: Goal: Ability to maintain clinical measurements within normal limits will improve 07/06/2021 1436 by Merrilee Seashore, RN Outcome: Adequate for Discharge 07/06/2021 1436 by Merrilee Seashore, RN Outcome: Progressing Goal: Will remain free from infection 07/06/2021 1436 by Merrilee Seashore, RN Outcome: Adequate for Discharge 07/06/2021 1436 by Merrilee Seashore, RN Outcome: Progressing Goal: Diagnostic test results will improve 07/06/2021 1436 by Merrilee Seashore, RN Outcome: Adequate for Discharge 07/06/2021 1436 by Merrilee Seashore, RN Outcome: Progressing Goal: Respiratory complications will improve 07/06/2021 1436 by Merrilee Seashore, RN Outcome: Adequate for Discharge 07/06/2021 1436 by Merrilee Seashore, RN Outcome: Progressing Goal: Cardiovascular complication will be avoided 07/06/2021 1436 by Merrilee Seashore, RN Outcome: Adequate for Discharge 07/06/2021 1436 by Merrilee Seashore, RN Outcome: Progressing   Problem: Activity: Goal: Risk for activity intolerance will decrease 07/06/2021 1436 by Merrilee Seashore, RN Outcome: Adequate for Discharge 07/06/2021 1436 by Merrilee Seashore, RN Outcome: Progressing   Problem: Nutrition: Goal: Adequate nutrition will be maintained 07/06/2021 1436 by Merrilee Seashore, RN Outcome: Adequate for Discharge 07/06/2021 1436 by Merrilee Seashore, RN Outcome: Progressing   Problem: Coping: Goal: Level of anxiety will decrease 07/06/2021 1436 by Merrilee Seashore, RN Outcome: Adequate for Discharge 07/06/2021 1436 by Merrilee Seashore, RN Outcome: Progressing   Problem: Elimination: Goal: Will not experience complications related to bowel motility 07/06/2021 1436 by Merrilee Seashore, RN Outcome: Adequate for Discharge 07/06/2021 1436 by Merrilee Seashore, RN Outcome: Progressing Goal: Will not experience complications related to urinary retention 07/06/2021 1436 by Merrilee Seashore, RN Outcome: Adequate for Discharge 07/06/2021 1436 by Merrilee Seashore, RN Outcome: Progressing   Problem: Pain Managment: Goal: General experience of comfort will improve 07/06/2021 1436 by Merrilee Seashore, RN Outcome: Adequate for Discharge 07/06/2021 1436 by Merrilee Seashore, RN Outcome: Progressing   Problem: Safety: Goal: Ability to remain free from injury will improve 07/06/2021 1436 by Merrilee Seashore, RN Outcome: Adequate for Discharge 07/06/2021 1436 by Merrilee Seashore, RN Outcome: Progressing   Problem: Skin Integrity: Goal: Risk for impaired skin integrity will decrease 07/06/2021 1436 by Merrilee Seashore, RN Outcome: Adequate for Discharge 07/06/2021 1436 by Merrilee Seashore, RN Outcome: Progressing   Problem: Education: Goal: Knowledge of disease or condition will improve 07/06/2021 1436 by Merrilee Seashore, RN Outcome: Adequate for Discharge 07/06/2021 1436 by Merrilee Seashore, RN Outcome: Progressing Goal: Knowledge of secondary prevention will improve (SELECT ALL) 07/06/2021 1436 by Merrilee Seashore, RN Outcome: Adequate for Discharge 07/06/2021 1436 by Merrilee Seashore, RN Outcome: Progressing Goal: Knowledge of patient specific risk factors will improve (INDIVIDUALIZE FOR PATIENT) 07/06/2021 1436 by Merrilee Seashore, RN Outcome: Adequate for Discharge 07/06/2021 1436 by Merrilee Seashore, RN Outcome:  Progressing Goal: Individualized Educational Video(s) 07/06/2021 1436 by Merrilee Seashore, RN Outcome: Adequate for Discharge 07/06/2021 1436 by Merrilee Seashore, RN Outcome: Progressing   Problem: Coping: Goal: Will verbalize positive feelings about self 07/06/2021 1436 by Jefm Petty  S, RN Outcome: Adequate for Discharge 07/06/2021 1436 by Merrilee Seashore, RN Outcome: Progressing Goal: Will identify appropriate support needs 07/06/2021 1436 by Merrilee Seashore, RN Outcome: Adequate for Discharge 07/06/2021 1436 by Merrilee Seashore, RN Outcome: Progressing   Problem: Health Behavior/Discharge Planning: Goal: Ability to manage health-related needs will improve 07/06/2021 1436 by Merrilee Seashore, RN Outcome: Adequate for Discharge 07/06/2021 1436 by Merrilee Seashore, RN Outcome: Progressing   Problem: Self-Care: Goal: Ability to participate in self-care as condition permits will improve 07/06/2021 1436 by Merrilee Seashore, RN Outcome: Adequate for Discharge 07/06/2021 1436 by Merrilee Seashore, RN Outcome: Progressing Goal: Verbalization of feelings and concerns over difficulty with self-care will improve 07/06/2021 1436 by Merrilee Seashore, RN Outcome: Adequate for Discharge 07/06/2021 1436 by Merrilee Seashore, RN Outcome: Progressing Goal: Ability to communicate needs accurately will improve 07/06/2021 1436 by Merrilee Seashore, RN Outcome: Adequate for Discharge 07/06/2021 1436 by Merrilee Seashore, RN Outcome: Progressing   Problem: Nutrition: Goal: Risk of aspiration will decrease 07/06/2021 1436 by Merrilee Seashore, RN Outcome: Adequate for Discharge 07/06/2021 1436 by Merrilee Seashore, RN Outcome: Progressing   Problem: Ischemic Stroke/TIA Tissue Perfusion: Goal: Complications of ischemic stroke/TIA will be minimized 07/06/2021 1436 by Merrilee Seashore, RN Outcome: Adequate for Discharge 07/06/2021 1436 by Merrilee Seashore, RN Outcome: Progressing

## 2021-07-06 NOTE — Evaluation (Signed)
Occupational Therapy Evaluation Patient Details Name: Anthony Joseph MRN: [id number] DOB: 20-Dec-1960 Today's Date: 07/06/2021   History of Present Illness Pt is a 60 y/o male admitted 10/23 secondary to LLE weakness. CT negative. Further workup pending. PMH includes DM and HTN.   Clinical Impression   Anthony Joseph was indep PTA, driving. He lives in a 1 level home with 2 STE, with his wife and daughter who can assist as needed. Upon evaluation pt was supervision level for all ADLs and functional mobility without AD. Decreased step length and foot clearance noted with LLE, and slightly decreased coordination with LUE however both were [facility] for functional tasks assessed. Pt does not have acute OT needs. Recommend d/c to home with supervision initially for ADLs and mobility.      Recommendations for follow up therapy are one component of a multi-disciplinary discharge planning process, led by the attending physician.  Recommendations may be updated based on patient status, additional functional criteria and insurance authorization.   Follow Up Recommendations  No OT follow up    Assistance Recommended at Discharge Intermittent Supervision/Assistance  Functional Status Assessment  Patient has had a recent decline in their functional status and demonstrates the ability to make significant improvements in function in a reasonable and predictable amount of time.  Equipment Recommendations  None recommended by OT    Recommendations for Other Services       Precautions / Restrictions Precautions Precautions: Fall Restrictions Weight Bearing Restrictions: No      Mobility Bed Mobility Overal bed mobility: Needs Assistance Bed Mobility: Supine to Sit     Supine to sit: Modified independent (Device/Increase time)     General bed mobility comments: mod I no assist needed, HOB slightly elevated    Transfers Overall transfer level: Needs assistance Equipment used: None Transfers: Sit  to/from Stand Sit to Stand: Supervision           General transfer comment: supervision for safety only - pt initally reaching fro external surface when standing      Balance Overall balance assessment: Needs assistance Sitting-balance support: No upper extremity supported;Feet supported Sitting balance-Leahy Scale: Good     Standing balance support: No upper extremity supported;During functional activity Standing balance-Leahy Scale: Good                             ADL either performed or assessed with clinical judgement   ADL Overall ADL's : Needs assistance/impaired Eating/Feeding: Independent;Sitting   Grooming: Supervision/safety;Standing   Upper Body Bathing: Supervision/ safety;Sitting   Lower Body Bathing: Supervison/ safety;Sit to/from stand   Upper Body Dressing : Supervision/safety;Sitting   Lower Body Dressing: Supervision/safety;Sit to/from stand   Toilet Transfer: Supervision/safety;Ambulation Toilet Transfer Details (indicate cue type and reason): stood to urinate Toileting- Architect and Hygiene: Supervision/safety;Sit to/from stand       Functional mobility during ADLs: Supervision/safety General ADL Comments: supervision throughout for safety - pt completed all grooming tasks while standing, no rest break required. Noted slightly impaired gait with decrased step length with LLE however no LOB and good safety awareness     Vision Baseline Vision/History: 1 Wears glasses Ability to See in Adequate Light: 0 Adequate Patient Visual Report: No change from baseline Vision Assessment?: No apparent visual deficits     Perception     Praxis      Pertinent Vitals/Pain Pain Assessment: No/denies pain     Hand Dominance Right   Extremity/Trunk Assessment Upper  Extremity Assessment Upper Extremity Assessment: Overall WFL for tasks assessed LUE Deficits / Details: Slightly impaired coordination however overall WFL for all  functional tasks   Lower Extremity Assessment Lower Extremity Assessment: Defer to PT evaluation   Cervical / Trunk Assessment Cervical / Trunk Assessment: Normal   Communication Communication Communication: No difficulties   Cognition Arousal/Alertness: Awake/alert Behavior During Therapy: WFL for tasks assessed/performed Overall Cognitive Status: Within Functional Limits for tasks assessed                                       General Comments  VSS on RA, no family present    Exercises     Shoulder Instructions      Home Living Family/patient expects to be discharged to:: Private residence Living Arrangements: Spouse/significant other;Children Available Help at Discharge: Family Type of Home: House Home Access: Stairs to enter Secretary/administrator of Steps: 2 Entrance Stairs-Rails: None Home Layout: One level     Bathroom Shower/Tub: Chief Strategy Officer: Handicapped height     Home Equipment: Cane - single point          Prior Functioning/Environment Prior Level of Function : Independent/Modified Independent;Driving                        OT Problem List: Decreased strength;Decreased range of motion;Impaired balance (sitting and/or standing);Decreased coordination;Impaired UE functional use      OT Treatment/Interventions:      OT Goals(Current goals can be found in the care plan section) Acute Rehab OT Goals Patient Stated Goal: home today OT Goal Formulation: All assessment and education complete, DC therapy  OT Frequency:     Barriers to D/C:            Co-evaluation              AM-PAC OT "6 Clicks" Daily Activity     Outcome Measure Help from another person eating meals?: None Help from another person taking care of personal grooming?: None Help from another person toileting, which includes using toliet, bedpan, or urinal?: None Help from another person bathing (including washing, rinsing,  drying)?: None Help from another person to put on and taking off regular upper body clothing?: None Help from another person to put on and taking off regular lower body clothing?: None 6 Click Score: 24   End of Session Equipment Utilized During Treatment: Gait belt Nurse Communication: Mobility status  Activity Tolerance: Patient tolerated treatment well Patient left: in chair;with call bell/phone within reach  OT Visit Diagnosis: Other abnormalities of gait and mobility (R26.89);Hemiplegia and hemiparesis Hemiplegia - Right/Left: Left Hemiplegia - dominant/non-dominant: Non-Dominant Hemiplegia - caused by: Unspecified                Time: 2993-7169 OT Time Calculation (min): 27 min Charges:  OT General Charges $OT Visit: 1 Visit OT Evaluation $OT Eval Moderate Complexity: 1 Mod OT Treatments $Self Care/Home Management : 8-22 mins   Rhondalyn Clingan A Mayia Megill 07/06/2021, 9:46 AM

## 2021-08-04 ENCOUNTER — Encounter (HOSPITAL_COMMUNITY): Payer: Self-pay | Admitting: Radiology

## 2021-08-14 ENCOUNTER — Ambulatory Visit: Payer: Self-pay

## 2021-08-14 NOTE — Telephone Encounter (Signed)
Patient returned call - patient had been admitted to hospital he is aware of results and has scheduled follow up

## 2021-08-14 NOTE — Telephone Encounter (Signed)
Patient called, left VM to return the call to speak to a nurse about results to [phone].   Message from Otis Brace sent at 08/14/2021  3:23 PM EST  Pt has recd one of the incidental finding letters and he has no primary doctor.  He was at [facility] in the ER when the test was done.  He did not want to schedule with a primary until after his ins change first of the year.   CB#  [phone]

## 2022-02-15 IMAGING — CT CT ANGIO HEAD-NECK (W OR W/O PERF)
1 of 11 series · 5 of 33 positions shown · IV contrast (omnipaque)
Comparison: 07/04/2021

CLINICAL DATA: Code stroke presentation yesterday.

EXAM:
CT ANGIOGRAPHY HEAD AND NECK
TECHNIQUE: Multidetector CT imaging of the head and neck was performed using
the standard protocol during bolus administration of intravenous
contrast. Multiplanar CT image reconstructions and MIPs were
obtained to evaluate the vascular anatomy. Carotid stenosis
measurements (when applicable) are obtained utilizing NASCET
criteria, using the distal internal carotid diameter as the
denominator.
CONTRAST:  100mL OMNIPAQUE IOHEXOL 350 MG/ML SOLN

[Series 11: cta neck axial · axial · 0.49mm/px · z∈[-236,+3]mm · 5 of 359 slices shown]
[im 60/359  soft-tissue]
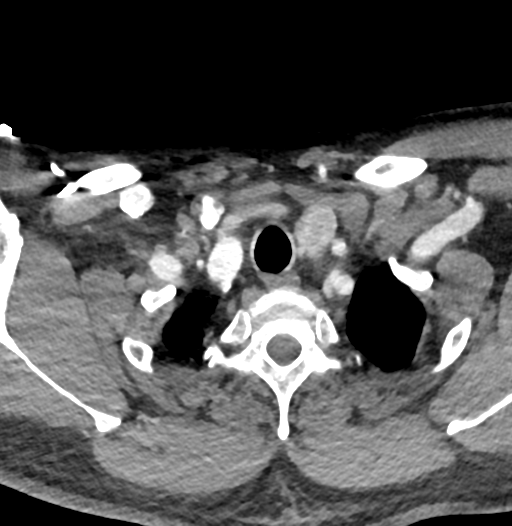
[im 120/359  bone]
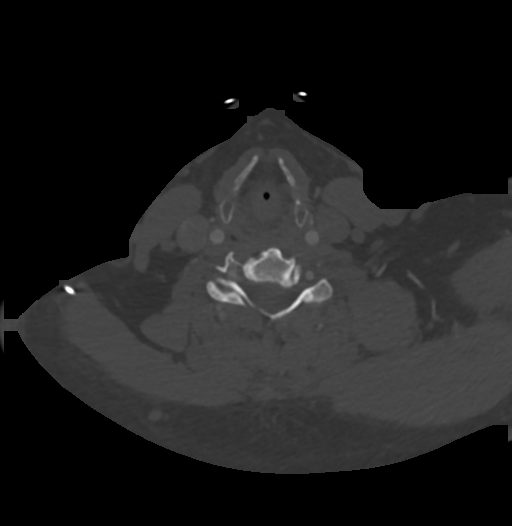
[im 180/359  soft-tissue]
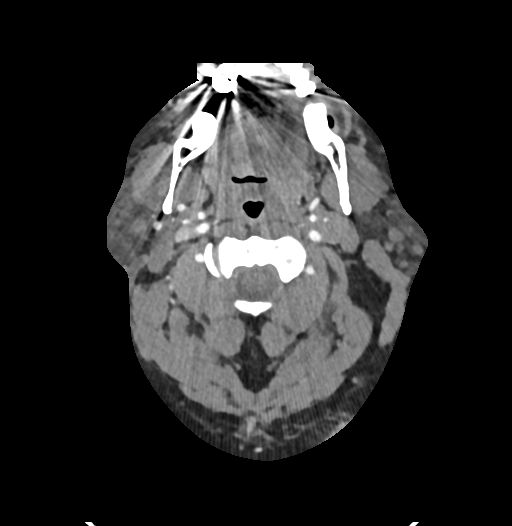
[im 239/359  bone]
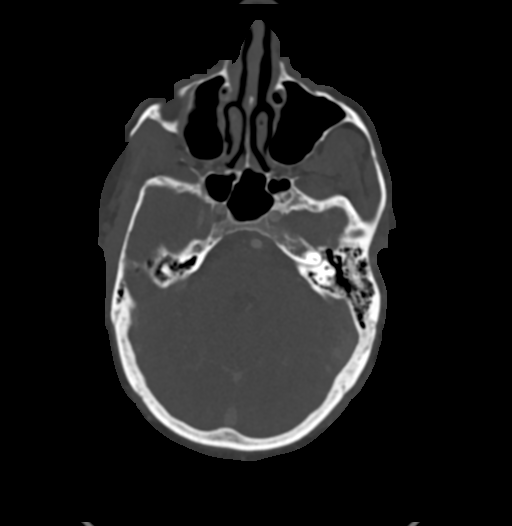
[im 299/359  soft-tissue]
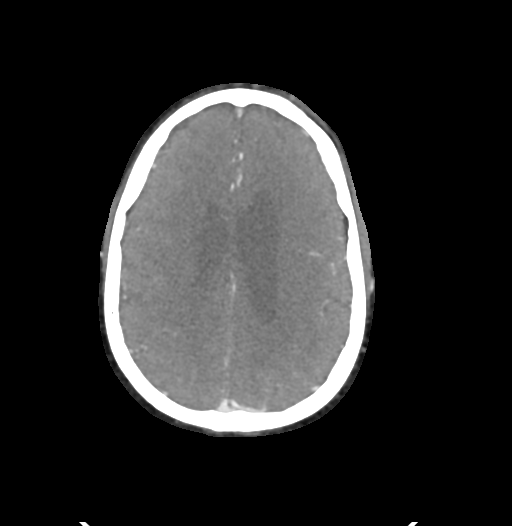

[5 of 33 positions shown; findings below may reference images not displayed]

FINDINGS: CT HEAD FINDINGS

Brain: No acute CT finding. Mild chronic small-vessel ischemic
change of the cerebral hemispheric white matter. No mass lesion,
hemorrhage, hydrocephalus or extra-axial collection.

Vascular: There is atherosclerotic calcification of the major
vessels at the base of the brain.

Skull: Negative

Sinuses: Clear

Orbits: Normal

Review of the MIP images confirms the above findings

CTA NECK FINDINGS

Aortic arch: Pronounced aortic atherosclerosis.

Right carotid system: Common carotid artery is widely patent to the
bifurcation. Carotid bifurcation is normal without soft or calcified
plaque. Cervical ICA is normal.

Left carotid system: Common carotid artery widely patent to the
bifurcation. Carotid bifurcation shows minimal soft plaque but no
stenosis. Cervical ICA is normal beyond the bifurcation.

Vertebral arteries: Both vertebral artery origins are widely patent.
Small amount of calcified plaque on the right 1 cm beyond the origin
but no significant stenosis. Both vertebral arteries appear normal
otherwise through the cervical region to the foramen magnum. The
right does take a tortuous loop within the intervertebral foramen at
C4-5.

Skeleton: Ordinary cervical spondylosis.

Other neck: No lymphadenopathy. Enlarged heterogeneous thyroid
gland. Question of a 3.7 cm nodule at the lower pole on the left.

Upper chest: Lung apices are clear.

Review of the MIP images confirms the above findings

CTA HEAD FINDINGS

Anterior circulation: Both internal carotid arteries are patent
through the skull base and siphon regions. Minimal siphon
atherosclerotic calcification but no stenosis. The anterior and
middle cerebral vessels are patent. No large vessel occlusion or
correctable proximal stenosis.

Posterior circulation: Both vertebral arteries are widely patent to
the basilar. No basilar stenosis. Posterior circulation branch
vessels are patent and unremarkable.

Venous sinuses: Patent and normal.

Anatomic variants: None significant.

Review of the MIP images confirms the above findings
IMPRESSION: No CT change since yesterday.  No sign of acute stroke.

No large vessel occlusion or correctable proximal stenosis.

No atherosclerotic disease of any significance at either carotid
bifurcation.

Aortic Atherosclerosis ([id number]).

Enlarged heterogeneous thyroid gland. Question of a 3.7 cm nodule at
the lower pole of the left lobe of the thyroid. Recommend thyroid US
(ref: [HOSPITAL]. [DATE]): 143-50).

## 2023-04-20 DIAGNOSIS — R7303 Prediabetes: Secondary | ICD-10-CM

## 2023-04-20 LAB — GLUCOSE, POCT (MANUAL RESULT ENTRY): POC Glucose: 136 mg/dl — AB (ref 70–99)

## 2023-04-20 NOTE — Congregational Nurse Program (Signed)
  Dept: [phone]   Congregational Nurse Program Note  Date of Encounter: 04/20/2023  At 1:45 pm client into nurse only clinic requesting BP check due to a history of TIA 2 years ago and glucose screening to follow up on "borderline diabetes". States hasn't had well care in over a year. States he attends a free clinic in [place] when they have one to get full physical and thinks that the next clinic is this month so he plans on attending. Doesn't have a PCP. Has insurance. Glucose 136 fasting. BP significantly elevated today at 180/120 and repeat in 10 minutes 176/118. P 76; O2 95%. Smokes ~3/4 pack of cigarettes / day. Last smoked in past 30 minutes. Co re impact of smoking on circulation. Agrees he has successfully cut back to 1/2 pack per day so will make that a goal this week. Co re limiting salt intake, increasing water intake. Co re need to see Dr asap but declines nurse's help in obtaining care and states he knows what to do and that it is most important to lose weight before returning to the dr as that brought his blood pressure down before. This nurse discussed resources and need for care with any symptoms and re his risks with past TIA. Also encouraged follow up re fasting glucose. Client states he understands. RTC prn. Rhermann, RN Past Medical History: Past Medical History:  Diagnosis Date   Obesity    Prediabetes     Encounter Details:  CNP Questionnaire - 04/20/23 2323       Questionnaire   Ask client: Do you give verbal consent for me to treat you today? Yes    Student Assistance N/A    Location Patient Information systems manager, [employer]    Visit Setting with Hospital doctor    Patient Status Unknown    Engineer, building services or [employer]    Insurance/Financial Assistance Referral N/A    Medication N/A    Medical Provider No    Screening Referrals Made Annual Wellness Visit    Medical Referrals Made [facility] PCP[facility]    Medical Appointment Made N/A    Recently w/o  PCP, now 1st time PCP visit completed due to CNs referral or appointment made N/A    Food Have Food Insecurities    Transportation N/A    Housing/Utilities N/A    Interpersonal Safety N/A    Interventions Navigate Healthcare System;Educate    Abnormal to Normal Screening Since Last CN Visit N/A    Screenings CN Performed Blood Pressure;Pulse Ox;Blood Glucose    Sent Client to Lab for: N/A    Did client attend any of the following based off CNs referral or appointments made? N/A    ED Visit Averted N/A    Life-Saving Intervention Made N/A

## 2024-03-26 ENCOUNTER — Emergency Department
Admission: EM | Admit: 2024-03-26 | Discharge: 2024-03-26 | Disposition: A | Discharge status: Home / Self Care | Attending: Emergency Medicine | Admitting: Emergency Medicine

## 2024-03-26 ENCOUNTER — Other Ambulatory Visit: Payer: Self-pay

## 2024-03-26 ENCOUNTER — Encounter: Payer: Self-pay | Admitting: Intensive Care

## 2024-03-26 DIAGNOSIS — H538 Other visual disturbances: Secondary | ICD-10-CM | POA: Insufficient documentation

## 2024-03-26 DIAGNOSIS — R739 Hyperglycemia, unspecified: Secondary | ICD-10-CM | POA: Insufficient documentation

## 2024-03-26 HISTORY — DX: Transient cerebral ischemic attack, unspecified: G45.9

## 2024-03-26 LAB — BASIC METABOLIC PANEL WITH GFR
Anion gap: 16 — ABNORMAL HIGH (ref 5–15)
BUN: 18 mg/dL (ref 8–23)
CO2: 25 mmol/L (ref 22–32)
Calcium: 9.8 mg/dL (ref 8.9–10.3)
Chloride: 90 mmol/L — ABNORMAL LOW (ref 98–111)
Creatinine, Ser: 1.15 mg/dL (ref 0.61–1.24)
GFR, Estimated: >60 mL/min (ref >60)
Glucose, Bld: 500 mg/dL — ABNORMAL HIGH (ref 70–99)
Potassium: 3.8 mmol/L (ref 3.5–5.1)
Sodium: 131 mmol/L — ABNORMAL LOW (ref 135–145)

## 2024-03-26 LAB — CBC
HCT: 43.5 % (ref 39.0–52.0)
Hemoglobin: 15 g/dL (ref 13.0–17.0)
MCH: 28.2 pg (ref 26.0–34.0)
MCHC: 34.5 g/dL (ref 30.0–36.0)
MCV: 81.9 fL (ref 80.0–100.0)
Platelets: 227 K/uL (ref 150–400)
RBC: 5.31 MIL/uL (ref 4.22–5.81)
RDW: 12.7 % (ref 11.5–15.5)
WBC: 8.3 K/uL (ref 4.0–10.5)
nRBC: 0 % (ref 0.0–0.2)

## 2024-03-26 LAB — URINALYSIS, ROUTINE W REFLEX MICROSCOPIC
Bilirubin Urine: NEGATIVE
Glucose, UA: >=500 mg/dL — AB
Hgb urine dipstick: NEGATIVE
Ketones, ur: 5 mg/dL — AB
Leukocytes,Ua: NEGATIVE
Nitrite: NEGATIVE
Protein, ur: NEGATIVE mg/dL
Specific Gravity, Urine: 1.022 (ref 1.005–1.030)
Squamous Epithelial / HPF: 0 /HPF (ref 0–5)
WBC, UA: 0 WBC/hpf (ref 0–5)
pH: 5 (ref 5.0–8.0)

## 2024-03-26 LAB — CBG MONITORING, ED
Glucose-Capillary: 503 mg/dL (ref 70–99)
Glucose-Capillary: 440 mg/dL — ABNORMAL HIGH (ref 70–99)
Glucose-Capillary: 448 mg/dL — ABNORMAL HIGH (ref 70–99)

## 2024-03-26 MED ORDER — INSULIN ASPART 100 UNIT/ML IJ SOLN
20.0000 [IU] | Freq: Once | INTRAMUSCULAR | Status: DC
Start: 2024-03-26 — End: 2024-03-26

## 2024-03-26 MED ORDER — INSULIN ASPART 100 UNIT/ML IJ SOLN
10.0000 [IU] | Freq: Once | INTRAMUSCULAR | Status: AC
  Administered 2024-03-26: 10 [IU] via SUBCUTANEOUS
  Filled 2024-03-26: qty 1

## 2024-03-26 MED ORDER — METFORMIN HCL 500 MG PO TABS: 500.0000 mg | ORAL_TABLET | Freq: Two times a day (BID) | ORAL | 1 refills | Status: AC

## 2024-03-26 MED ORDER — METFORMIN HCL 500 MG PO TABS: 500.0000 mg | ORAL_TABLET | Freq: Two times a day (BID) | ORAL | 1 refills | Status: DC

## 2024-03-26 MED ORDER — INSULIN ASPART 100 UNIT/ML IJ SOLN
15.0000 [IU] | Freq: Once | INTRAMUSCULAR | Status: AC
  Administered 2024-03-26: 15 [IU] via SUBCUTANEOUS
  Filled 2024-03-26: qty 1

## 2024-03-26 MED ORDER — SODIUM CHLORIDE 0.9 % IV BOLUS
1000.0000 mL | Freq: Once | INTRAVENOUS | Status: AC
  Administered 2024-03-26: 1000 mL via INTRAVENOUS

## 2024-03-26 NOTE — Discharge Instructions (Signed)
 Follow-up with your regular doctor, you can call even though you will be a new patient to see if they can push up your appointment since you have been diagnosed with diabetes. If not I have put in a ambulatory referral for you to go to the nutrition center at [facility] for diabetes counseling I also put in a referral for primary care Take the metformin  as prescribed.  It may cause diarrhea at first do not be alarmed.  This will start to resolve as you get used to the medication.

## 2024-03-26 NOTE — ED Provider Notes (Signed)
 Patient with hyperglycemia.  Blood glucose up to 600 upon arrival but down to mid 400s.  Patient received insulin  and a bolus of fluids.  Patient with no nausea, vomiting.  He feels well.  States he is asymptomatic.  He would like to return home. Physical Exam  BP (!) 153/121 (BP Location: Left Arm)   Pulse (!) 106   Temp 98.6 F (37 C) (Oral)   Resp 18   Ht 5' 10.5 (1.791 m)   SpO2 98%   BMI 36.83 kg/m   Physical Exam Constitutional:      Appearance: He is well-developed.  HENT:     Head: Normocephalic and atraumatic.     Mouth/Throat:     Pharynx: No oropharyngeal exudate or posterior oropharyngeal erythema.  Eyes:     Conjunctiva/sclera: Conjunctivae normal.  Cardiovascular:     Rate and Rhythm: Normal rate.  Pulmonary:     Effort: Pulmonary effort is normal. No respiratory distress.  Musculoskeletal:        General: Normal range of motion.     Cervical back: Normal range of motion.  Skin:    General: Skin is warm.     Findings: No rash.  Neurological:     General: No focal deficit present.     Mental Status: He is alert and oriented to person, place, and time. Mental status is at baseline.  Psychiatric:        Mood and Affect: Mood normal.        Behavior: Behavior normal.        Thought Content: Thought content normal.     Procedures  Procedures  ED Course / MDM    Medical Decision Making Amount and/or Complexity of Data Reviewed Labs: ordered.  Risk Prescription drug management.   63 year old male with new onset diabetes.  Blood glucose levels improved to 440 with fluids and NovoLog .  He has not been on any medications and admits to consuming soft drinks daily.  We discussed admission to the hospital for continued monitoring and treatment of his hyperglycemia but patient refused admission, states he would like to go home and continue with the metformin .  Patient asymptomatic.  Vital signs stable.  No signs of DKA.  Patient discharged home with strict  return precautions.  He is given prescription for metformin  and given education/counseling on current changes to his diet that need to be made.  He understands signs and symptoms return to the ER for.       Zygmund, Passero 03/26/24 2128    Malvina Alm DASEN, MD 03/26/24 [phone]

## 2024-03-26 NOTE — ED Provider Notes (Signed)
 [facility] Provider Note    Event Date/Time   First MD Initiated Contact with Patient 03/26/24 1715     (approximate)   History   Hyperglycemia   HPI  Anthony Joseph is a 63 y.o. male history of prediabetes, obesity, elevated blood pressure presents emergency department with concerns of high glucose.  Had a high reading at home.  States his sister is diabetic and checks his sugar because he had had some blurry vision along with increased thirst and urination.  States he just felt funny.  When she checked his glucose it was 600 at home.  States she told to come to the ER right away.  Patient tried to make an appointment with a primary care but cannot be seen for another 3 months      Physical Exam   Triage Vital Signs: ED Triage Vitals  Encounter Vitals Group     BP 03/26/24 1511 (!) 153/121     Girls Systolic BP Percentile --      Girls Diastolic BP Percentile --      Boys Systolic BP Percentile --      Boys Diastolic BP Percentile --      Pulse Rate 03/26/24 1511 (!) 106     Resp 03/26/24 1511 18     Temp 03/26/24 1511 98.6 F (37 C)     Temp Source 03/26/24 1511 Oral     SpO2 03/26/24 1511 98 %     Weight --      Height 03/26/24 1513 5' 10.5 (1.791 m)     Head Circumference --      Peak Flow --      Pain Score 03/26/24 1512 0     Pain Loc --      Pain Education --      Exclude from Growth Chart --     Most recent vital signs: Vitals:   03/26/24 1511  BP: (!) 153/121  Pulse: (!) 106  Resp: 18  Temp: 98.6 F (37 C)  SpO2: 98%     General: Awake, no distress.   CV:  Good peripheral perfusion. regular rate and  rhythm Resp:  Normal effort. Lungs cta Abd:  No distention.   Other:      ED Results / Procedures / Treatments   Labs (all labs ordered are listed, but only abnormal results are displayed) Labs Reviewed  BASIC METABOLIC PANEL WITH GFR - Abnormal; Notable for the following components:      Result Value   Sodium  131 (*)    Chloride 90 (*)    Glucose, Bld 500 (*)    Anion gap 16 (*)    All other components within normal limits  URINALYSIS, ROUTINE W REFLEX MICROSCOPIC - Abnormal; Notable for the following components:   Color, Urine STRAW (*)    APPearance CLEAR (*)    Glucose, UA >=500 (*)    Ketones, ur 5 (*)    Bacteria, UA RARE (*)    All other components within normal limits  CBG MONITORING, ED - Abnormal; Notable for the following components:   Glucose-Capillary 503 (*)    All other components within normal limits  CBG MONITORING, ED - Abnormal; Notable for the following components:   Glucose-Capillary 440 (*)    All other components within normal limits  CBC     EKG     RADIOLOGY     PROCEDURES:   Procedures  Critical Care:  no Chief Complaint  Patient presents with   Hyperglycemia      MEDICATIONS ORDERED IN ED: Medications  insulin  aspart (novoLOG ) injection 15 Units (has no administration in time range)  sodium chloride  0.9 % bolus 1,000 mL (1,000 mLs Intravenous New Bag/Given 03/26/24 1736)  insulin  aspart (novoLOG ) injection 10 Units (10 Units Subcutaneous Given 03/26/24 1737)     IMPRESSION / MDM / ASSESSMENT AND PLAN / ED COURSE  I reviewed the triage vital signs and the nursing notes.                              Differential diagnosis includes, but is not limited to, prediabetes, diabetes, hyperglycemia, DKA  Patient's presentation is most consistent with acute illness / injury with system symptoms.    Medications given: Normal saline 1 L IV, insulin  10 units subcu  DKA less likely patient's had no vomiting diarrhea or other symptoms of elevated glucose other than the increased thirst, urination, and blurred vision.  With his glucose being 500 and 600 at home I do feel this is most likely the cause of his blurred vision, increased thirst and urination.  Glucose here today in the ED is 500, remainder his labs are reassuring, will give normal  saline 1 L IV, 10 units of insulin  subcu.  Will recheck his CBG and if it is decreasing in appropriate manner we will start him on metformin .  Will give him a 90-day supply and primary care referral due to new onset of diabetes   First CBG recheck was 440, will give another 15 units insulin , will recheck in 15 minutes, if has decreased will discharge to home with metformin .  Ambulatory referrals for primary care and diabetes/nutrition services initiated.  Patient is in agreement with this treatment plan.  Care transferred to Medford Amber, PA-C FINAL CLINICAL IMPRESSION(S) / ED DIAGNOSES   Final diagnoses:  Hyperglycemia     Rx / DC Orders   ED Discharge Orders          Ordered    metFORMIN  (GLUCOPHAGE ) 500 MG tablet  2 times daily with meals        03/26/24 1846    Referral to Nutrition and Diabetes Services        03/26/24 1848    Ambulatory Referral to Primary Care (Establish Care)       Comments: New onset diabetes   03/26/24 1848             Note:  This document was prepared using Dragon voice recognition software and may include unintentional dictation errors.    Gasper Devere ORN, PA-C 03/26/24 1851    Malvina Alm DASEN, MD 03/26/24 [phone]

## 2024-03-26 NOTE — ED Triage Notes (Addendum)
 Patient reports his sugar reading was high at home. Also reports intermittent high blood pressure X3 days. Reports he is pre-diabetic   Symptoms of dry mouth, urinating often, blurry vision bilateral, and intermittent headaches X3-4 days  Blood sugar 500s in triage

## 2024-03-26 NOTE — ED Notes (Signed)
 Pt out of room asking how much longer am I going to be here, I was at home and got my sugar down from 600 to 500 and yall give me medicine and can only get it down 50, something is not right with that, do I just need to go home and do what I was doing there. Pt back in room NAD
